# Patient Record
Sex: Female | Born: 1955 | Race: Black or African American | Hispanic: No | Smoking: Former smoker
Health system: Southern US, Community
[De-identification: ages and names within clinical notes are randomized; demographics above are authoritative.]

## PROBLEM LIST (undated history)

## (undated) DIAGNOSIS — E079 Disorder of thyroid, unspecified: Secondary | ICD-10-CM

## (undated) HISTORY — PX: ABDOMINAL HYSTERECTOMY: SHX81

---

## 1998-07-19 ENCOUNTER — Ambulatory Visit (HOSPITAL_COMMUNITY): Admission: RE | Admit: 1998-07-19 | Discharge: 1998-07-19 | Payer: Self-pay | Admitting: Internal Medicine

## 1999-04-11 ENCOUNTER — Ambulatory Visit (HOSPITAL_COMMUNITY): Admission: RE | Admit: 1999-04-11 | Discharge: 1999-04-11 | Payer: Self-pay | Admitting: Internal Medicine

## 1999-04-11 ENCOUNTER — Encounter: Payer: Self-pay | Admitting: Internal Medicine

## 1999-06-06 ENCOUNTER — Ambulatory Visit (HOSPITAL_COMMUNITY): Admission: RE | Admit: 1999-06-06 | Discharge: 1999-06-06 | Payer: Self-pay | Admitting: Gastroenterology

## 1999-10-08 ENCOUNTER — Encounter: Payer: Self-pay | Admitting: Emergency Medicine

## 1999-10-08 ENCOUNTER — Emergency Department (HOSPITAL_COMMUNITY): Admission: EM | Admit: 1999-10-08 | Discharge: 1999-10-08 | Payer: Self-pay | Admitting: Emergency Medicine

## 2000-11-07 ENCOUNTER — Emergency Department (HOSPITAL_COMMUNITY): Admission: EM | Admit: 2000-11-07 | Discharge: 2000-11-07 | Payer: Self-pay | Admitting: *Deleted

## 2000-11-09 ENCOUNTER — Encounter: Payer: Self-pay | Admitting: Emergency Medicine

## 2000-11-09 ENCOUNTER — Emergency Department (HOSPITAL_COMMUNITY): Admission: EM | Admit: 2000-11-09 | Discharge: 2000-11-09 | Payer: Self-pay | Admitting: Psychiatry

## 2000-11-14 ENCOUNTER — Emergency Department (HOSPITAL_COMMUNITY): Admission: EM | Admit: 2000-11-14 | Discharge: 2000-11-14 | Payer: Self-pay | Admitting: Emergency Medicine

## 2000-11-25 ENCOUNTER — Encounter: Payer: Self-pay | Admitting: Internal Medicine

## 2000-11-25 ENCOUNTER — Ambulatory Visit (HOSPITAL_COMMUNITY): Admission: RE | Admit: 2000-11-25 | Discharge: 2000-11-25 | Payer: Self-pay | Admitting: Internal Medicine

## 2000-12-04 ENCOUNTER — Ambulatory Visit (HOSPITAL_COMMUNITY): Admission: RE | Admit: 2000-12-04 | Discharge: 2000-12-04 | Payer: Self-pay | Admitting: Internal Medicine

## 2000-12-04 ENCOUNTER — Encounter: Payer: Self-pay | Admitting: Internal Medicine

## 2001-04-11 ENCOUNTER — Encounter: Payer: Self-pay | Admitting: Pulmonary Disease

## 2001-04-11 ENCOUNTER — Ambulatory Visit (HOSPITAL_COMMUNITY): Admission: RE | Admit: 2001-04-11 | Discharge: 2001-04-11 | Payer: Self-pay | Admitting: Pulmonary Disease

## 2001-10-27 ENCOUNTER — Emergency Department (HOSPITAL_COMMUNITY): Admission: EM | Admit: 2001-10-27 | Discharge: 2001-10-27 | Payer: Self-pay | Admitting: Emergency Medicine

## 2003-06-03 ENCOUNTER — Ambulatory Visit (HOSPITAL_BASED_OUTPATIENT_CLINIC_OR_DEPARTMENT_OTHER): Admission: RE | Admit: 2003-06-03 | Discharge: 2003-06-03 | Payer: Self-pay | Admitting: Orthopedic Surgery

## 2003-12-03 ENCOUNTER — Ambulatory Visit (HOSPITAL_COMMUNITY): Admission: RE | Admit: 2003-12-03 | Discharge: 2003-12-03 | Payer: Self-pay | Admitting: Orthopedic Surgery

## 2003-12-23 ENCOUNTER — Ambulatory Visit (HOSPITAL_BASED_OUTPATIENT_CLINIC_OR_DEPARTMENT_OTHER): Admission: RE | Admit: 2003-12-23 | Discharge: 2003-12-23 | Payer: Self-pay | Admitting: Orthopedic Surgery

## 2004-05-06 ENCOUNTER — Emergency Department (HOSPITAL_COMMUNITY): Admission: EM | Admit: 2004-05-06 | Discharge: 2004-05-07 | Payer: Self-pay | Admitting: Emergency Medicine

## 2004-05-07 ENCOUNTER — Ambulatory Visit (HOSPITAL_COMMUNITY): Admission: RE | Admit: 2004-05-07 | Discharge: 2004-05-07 | Payer: Self-pay | Admitting: Emergency Medicine

## 2004-08-14 ENCOUNTER — Encounter: Admission: RE | Admit: 2004-08-14 | Discharge: 2004-08-14 | Payer: Self-pay | Admitting: Family Medicine

## 2004-08-25 ENCOUNTER — Encounter: Admission: RE | Admit: 2004-08-25 | Discharge: 2004-08-25 | Payer: Self-pay | Admitting: Family Medicine

## 2008-08-12 ENCOUNTER — Emergency Department (HOSPITAL_COMMUNITY): Admission: EM | Admit: 2008-08-12 | Discharge: 2008-08-12 | Payer: Self-pay | Admitting: *Deleted

## 2008-12-30 ENCOUNTER — Ambulatory Visit: Payer: Self-pay | Admitting: Family Medicine

## 2008-12-30 DIAGNOSIS — Z8669 Personal history of other diseases of the nervous system and sense organs: Secondary | ICD-10-CM

## 2008-12-30 DIAGNOSIS — F410 Panic disorder [episodic paroxysmal anxiety] without agoraphobia: Secondary | ICD-10-CM

## 2009-01-06 ENCOUNTER — Encounter: Payer: Self-pay | Admitting: Family Medicine

## 2009-01-06 ENCOUNTER — Ambulatory Visit: Payer: Self-pay | Admitting: Family Medicine

## 2009-01-14 ENCOUNTER — Encounter: Payer: Self-pay | Admitting: Family Medicine

## 2009-01-14 ENCOUNTER — Ambulatory Visit: Payer: Self-pay | Admitting: Family Medicine

## 2009-01-14 LAB — CONVERTED CEMR LAB: Total CHOL/HDL Ratio: 2.4

## 2009-01-21 ENCOUNTER — Ambulatory Visit (HOSPITAL_COMMUNITY): Admission: RE | Admit: 2009-01-21 | Discharge: 2009-01-21 | Payer: Self-pay | Admitting: Family Medicine

## 2009-02-15 ENCOUNTER — Encounter: Payer: Self-pay | Admitting: Family Medicine

## 2009-02-15 LAB — CONVERTED CEMR LAB
ALT: 14 units/L
AST: 13 units/L
Bilirubin, Direct: 0.2 mg/dL
CO2: 26 meq/L
Calcium: 9.8 mg/dL
Chloride: 104 meq/L
Glucose, Bld: 91 mg/dL
Hemoglobin: 12.7 g/dL
MCHC: 32.2 g/dL
Potassium: 3.9 meq/L
RDW: 13.3 %
Sodium: 141 meq/L

## 2009-03-08 ENCOUNTER — Encounter: Payer: Self-pay | Admitting: Family Medicine

## 2009-09-12 ENCOUNTER — Encounter: Admission: RE | Admit: 2009-09-12 | Discharge: 2009-09-12 | Payer: Self-pay | Admitting: Family Medicine

## 2009-09-19 ENCOUNTER — Ambulatory Visit (HOSPITAL_COMMUNITY): Admission: RE | Admit: 2009-09-19 | Discharge: 2009-09-19 | Payer: Self-pay | Admitting: Family Medicine

## 2011-01-09 ENCOUNTER — Observation Stay (HOSPITAL_COMMUNITY)
Admission: EM | Admit: 2011-01-09 | Discharge: 2011-01-12 | Payer: Self-pay | Source: Home / Self Care | Attending: Internal Medicine | Admitting: Internal Medicine

## 2011-01-10 LAB — COMPREHENSIVE METABOLIC PANEL
ALT: 24 U/L (ref 0–35)
AST: 19 U/L (ref 0–37)
AST: 16 U/L (ref 0–37)
Albumin: 3.2 g/dL — ABNORMAL LOW (ref 3.5–5.2)
Albumin: 2.8 g/dL — ABNORMAL LOW (ref 3.5–5.2)
Alkaline Phosphatase: 41 U/L (ref 39–117)
Alkaline Phosphatase: 43 U/L (ref 39–117)
BUN: 14 mg/dL (ref 6–23)
BUN: 11 mg/dL (ref 6–23)
CO2: 25 mEq/L (ref 19–32)
CO2: 26 mEq/L (ref 19–32)
Calcium: 10 mg/dL (ref 8.4–10.5)
Chloride: 106 mEq/L (ref 96–112)
Chloride: 109 mEq/L (ref 96–112)
Creatinine, Ser: 0.52 mg/dL (ref 0.4–1.2)
GFR calc Af Amer: >60 mL/min (ref >60)
GFR calc Af Amer: >60 mL/min (ref >60)
GFR calc non Af Amer: >60 mL/min (ref >60)
GFR calc non Af Amer: >60 mL/min (ref >60)
Glucose, Bld: 85 mg/dL (ref 70–99)
Potassium: 3.7 mEq/L (ref 3.5–5.1)
Potassium: 3.6 mEq/L (ref 3.5–5.1)
Sodium: 141 mEq/L (ref 135–145)
Total Bilirubin: 1.3 mg/dL — ABNORMAL HIGH (ref 0.3–1.2)
Total Bilirubin: 0.9 mg/dL (ref 0.3–1.2)
Total Protein: 6.1 g/dL (ref 6.0–8.3)

## 2011-01-10 LAB — DIFFERENTIAL
Basophils Absolute: 0 10*3/uL (ref 0.0–0.1)
Basophils Relative: 0 % (ref 0–1)
Eosinophils Absolute: 0.2 10*3/uL (ref 0.0–0.7)
Eosinophils Relative: 2 % (ref 0–5)
Eosinophils Relative: 5 % (ref 0–5)
Lymphocytes Relative: 28 % (ref 12–46)
Lymphocytes Relative: 38 % (ref 12–46)
Lymphs Abs: 2.2 10*3/uL (ref 0.7–4.0)
Lymphs Abs: 2.3 10*3/uL (ref 0.7–4.0)
Monocytes Absolute: 1.1 10*3/uL — ABNORMAL HIGH (ref 0.1–1.0)
Monocytes Relative: 15 % — ABNORMAL HIGH (ref 3–12)
Monocytes Relative: 15 % — ABNORMAL HIGH (ref 3–12)
Neutro Abs: 4.1 10*3/uL (ref 1.7–7.7)
Neutrophils Relative %: 54 % (ref 43–77)

## 2011-01-10 LAB — EXPECTORATED SPUTUM ASSESSMENT W GRAM STAIN, RFLX TO RESP C

## 2011-01-10 LAB — CBC
HCT: 32.1 % — ABNORMAL LOW (ref 36.0–46.0)
HCT: 30.9 % — ABNORMAL LOW (ref 36.0–46.0)
Hemoglobin: 10.6 g/dL — ABNORMAL LOW (ref 12.0–15.0)
MCH: 28.6 pg (ref 26.0–34.0)
MCH: 28.8 pg (ref 26.0–34.0)
MCHC: 33 g/dL (ref 30.0–36.0)
MCV: 86.8 fL (ref 78.0–100.0)
MCV: 88 fL (ref 78.0–100.0)
Platelets: 263 10*3/uL (ref 150–400)
RBC: 3.7 MIL/uL — ABNORMAL LOW (ref 3.87–5.11)
RDW: 12.1 % (ref 11.5–15.5)
RDW: 12.2 % (ref 11.5–15.5)
WBC: 7.6 10*3/uL (ref 4.0–10.5)
WBC: 6 10*3/uL (ref 4.0–10.5)

## 2011-01-10 LAB — POCT CARDIAC MARKERS
CKMB, poc: 1.8 ng/mL (ref 1.0–8.0)
Myoglobin, poc: 123 ng/mL (ref 12–200)
Troponin i, poc: <0.05 ng/mL (ref 0.00–0.09)

## 2011-01-10 LAB — LIPID PANEL
Cholesterol: 115 mg/dL (ref 0–200)
HDL: 47 mg/dL (ref >39)
LDL Cholesterol: 52 mg/dL (ref 0–99)
Total CHOL/HDL Ratio: 2.4 RATIO
Triglycerides: 78 mg/dL (ref <150)
VLDL: 16 mg/dL (ref 0–40)

## 2011-01-10 LAB — CK TOTAL AND CKMB (NOT AT ARMC)
CK, MB: 1.4 ng/mL (ref 0.3–4.0)
Relative Index: INVALID (ref 0.0–2.5)
Total CK: 71 U/L (ref 7–177)

## 2011-01-10 LAB — D-DIMER, QUANTITATIVE: D-Dimer, Quant: 1.15 ug/mL-FEU — ABNORMAL HIGH (ref 0.00–0.48)

## 2011-01-10 LAB — HEMOGLOBIN A1C: Mean Plasma Glucose: 103 mg/dL (ref <117)

## 2011-01-10 LAB — RAPID STREP SCREEN (MED CTR MEBANE ONLY): Streptococcus, Group A Screen (Direct): NEGATIVE

## 2011-01-10 LAB — PHOSPHORUS: Phosphorus: 5.1 mg/dL — ABNORMAL HIGH (ref 2.3–4.6)

## 2011-01-10 LAB — IRON AND TIBC
Iron: 46 ug/dL (ref 42–135)
Saturation Ratios: 19 % — ABNORMAL LOW (ref 20–55)
TIBC: 246 ug/dL — ABNORMAL LOW (ref 250–470)
UIBC: 200 ug/dL

## 2011-01-10 LAB — VITAMIN B12: Vitamin B-12: 444 pg/mL (ref 211–911)

## 2011-01-10 LAB — TROPONIN I: Troponin I: 0.01 ng/mL (ref 0.00–0.06)

## 2011-01-10 LAB — APTT: aPTT: 34 seconds (ref 24–37)

## 2011-01-10 LAB — FOLATE: Folate: >20 ng/mL

## 2011-01-11 LAB — HEMOCCULT GUIAC POC 1CARD (OFFICE)
Fecal Occult Bld: NEGATIVE
Fecal Occult Bld: NEGATIVE

## 2011-01-11 LAB — TSH: TSH: <0.008 u[IU]/mL — ABNORMAL LOW (ref 0.350–4.500)

## 2011-01-11 LAB — T4, FREE: Free T4: 2.85 ng/dL — ABNORMAL HIGH (ref 0.80–1.80)

## 2011-01-12 ENCOUNTER — Other Ambulatory Visit (HOSPITAL_COMMUNITY): Payer: Self-pay | Admitting: Internal Medicine

## 2011-01-12 DIAGNOSIS — E039 Hypothyroidism, unspecified: Secondary | ICD-10-CM

## 2011-01-12 DIAGNOSIS — R0602 Shortness of breath: Secondary | ICD-10-CM

## 2011-01-12 LAB — HEMOCCULT GUIAC POC 1CARD (OFFICE): Fecal Occult Bld: NEGATIVE

## 2011-01-13 NOTE — H&P (Signed)
Angel Holt, Angel Holt                ACCOUNT NO.:  [id number]  MEDICAL RECORD NO.:  [id number]          PATIENT TYPE:  EMS  LOCATION:  MAJO                         FACILITY:  [facility]  PHYSICIAN:  Michiel Cowboy, MDDATE OF BIRTH:  11-16-1956  DATE OF ADMISSION:  01/09/2011 DATE OF DISCHARGE:                             HISTORY & PHYSICAL   ATTENDING PHYSICIAN:  Michiel Cowboy, MD  PRIMARY CARE PROVIDER:  The patient states she is unsure, therefore she has not seen anybody.  CHIEF COMPLAINT:  Shortness of breath and trouble swallowing.  HISTORY OF PRESENT ILLNESS:  The patient is a 55 year old female with past medical history which is not really contributory who for the past 3 weeks have been having worsening dyspnea and exertion particularly when she was walking up and down stairs.  Also occasional mild nonspecific chest pains which are short lived.  Also she has been feeling like her mouth is very dry and she has been doing a lot of mouth breathing.  When she swallows, she feels like it is going a wrong way and she feels like her throat is closed.  Of note, she has no stridor or wheezing on exam. Otherwise, no fevers.  She has had cold-like symptoms also for the past few weeks with runny nose, cough, productive of phlegm.  No diarrhea, otherwise review of systems is negative.  The patient did endorse that she feels like she has had unintentional weight loss over the past 3 weeks up to 20 pounds.  PAST MEDICAL HISTORY:  Noncontributory.  SOCIAL HISTORY:  The patient used to smoke 20 years ago, does not currently, does not abuse drugs, does not drink.  FAMILY HISTORY:  Significant for a sister with diabetes mellitus.  ALLERGIES:  No known drug allergies.  MEDICATIONS:  Occasionally takes Mucinex recently for her cough.  PHYSICAL EXAMINATION:  VITAL SIGNS:  Temperature 98.5, blood pressure 117/82, pulse 106 initially, now down to 96, respirations 18, and satting  99% on room air. GENERAL:  The patient appears to be in no acute distress. HEAD:  Nontraumatic.  Moist mucous membranes. LUNGS:  Clear to auscultation bilaterally.  No wheezes or crackles appreciated.  No stridor appreciated. HEART:  Somewhat rapid but regular.  No murmurs appreciated. ABDOMEN:  Soft, nontender, nondistended, slightly obese. LOWER EXTREMITIES:  Without clubbing, cyanosis, or edema. NEUROLOGIC:  Grossly intact. SKIN:  Clean, dry, and intact. THYROID:  Slightly enlarged but otherwise unremarkable.  No lymphadenopathy noted. HEAD AND NECK:  She does seem to have some enlargement of the left tonsil, but no significant exudate could be appreciated.  She does not have hot potato voice.  LABORATORY DATA:  White blood cell count 7.6, hemoglobin 10.6, sodium 141, potassium 3.7, creatinine 0.52, D-dimer elevated 1.15.  Chest x-ray unremarkable.  CT scan showing no PE.  Chronic nodules was present and mild goiter, but otherwise unremarkable.  EKG showing normal sinus rhythm, slightly rapid heart rate of 100, but no ST changes.  ASSESSMENT AND PLAN:  This is a 55 year old female with shortness of breath for unclear etiology in the setting of recent cold-like symptoms ,but also  endorsing the weight loss. 1. Shortness of breath, etiology very unclear, pulmonary versus     cardiac versus anxiety.  We will admit for observation.  Given her     age and presentation of shortness of breath and occasional chest     pain, we will cycle cardiac enzymes.  Check 2-D echo to make sure     she has preserved EF.  We will do a 6-minute walk to monitor her O2     sat, check fasting lipid panel, hemoglobin A1c.  Give p.r.n.     albuterol if needed.  Continue with Mucinex and Robitussin.  At     this point, CT scan did not show any evidence of infection.  She     does not have a white blood cell count.  I will hold off on     antibiotics for now.  If her symptoms consist, consider Pulmonary      consult.  Given some dysphagia like symptoms of trouble swallowing,     we will do barium swallow evaluation.  The patient does not appear     to be toxic and had no lymphadenopathy or swollen, painful neck     pain.  I do think symptoms were consistent with peritonsillar     abscess, most likely she has a viral etiology of bronchitis which     has caused shortness of breath, but will be on a safe side and     evaluate this further. 2. Weight loss, this is unclear.  We will check TSH levels and     sedimentation rate.  Of note, she does have history of pulmonary     nodules, this needs to be further followed up and that is why I     think to have an pulmonary followup will be good for this patient. 3. Prophylaxis, good p.o. intake and SCDs. 4. Anemia, this is mild but we will further evaluate as suppose it is     possible that some degree of anemia is contributing to her     shortness of breath.     Michiel Cowboy, MD     AVD/MEDQ  D:  01/09/2011  T:  01/10/2011  Job:  [id number]  Electronically Signed by Therisa Doyne MD on 01/13/2011 06:25:12 AM

## 2011-01-24 ENCOUNTER — Emergency Department (HOSPITAL_COMMUNITY)
Admission: EM | Admit: 2011-01-24 | Discharge: 2011-01-24 | Disposition: A | Discharge status: Home / Self Care | Payer: Self-pay | Attending: Emergency Medicine | Admitting: Emergency Medicine

## 2011-01-24 DIAGNOSIS — E0590 Thyrotoxicosis, unspecified without thyrotoxic crisis or storm: Secondary | ICD-10-CM | POA: Insufficient documentation

## 2011-01-24 DIAGNOSIS — J029 Acute pharyngitis, unspecified: Secondary | ICD-10-CM | POA: Insufficient documentation

## 2011-01-30 NOTE — Discharge Summary (Signed)
Angel Holt, Angel Holt                ACCOUNT NO.:  [id number]  MEDICAL RECORD NO.:  [id number]          PATIENT TYPE:  OBV  LOCATION:  5528                         FACILITY:  [facility]  PHYSICIAN:  Baltazar Najjar, MD     DATE OF BIRTH:  July 02, 1956  DATE OF ADMISSION:  01/09/2011 DATE OF DISCHARGE:  01/12/2011                              DISCHARGE SUMMARY   FINAL DISCHARGE DIAGNOSES: 1. Dyspnea. 2. Pulmonary nodules. 3. Hyperparathyroidism, newly diagnosed. 4. Elevated ACE level of unclear significance. 5. Dysphagia.  CONSULTATION:  During this hospitalization; Pulmonary service.  The patient was seen by Dr. Sung Amabile from [facility].  PROCEDURES/IMAGING: 1. PFT done January 11, 2011 was normal.  Chest x-ray showed no acute     cardiopulmonary event.  CT angiogram of the chest showed no     pulmonary emboli or acute abnormalities seen. 2. Chronic bilateral lower lobe nodules. 3. Mild thyroid goiter.  BRIEF ADMITTING HISTORY:  Please refer to the H and P dictated January 09, 2011.  SUMMARY:  Angel Holt is a 55 year old African American woman with no significant past medical history presented to the ER with shortness of breath and difficulty swallowing.  HOSPITAL COURSE:  The patient was admitted to the medical floor. 1. Shortness of breath.  The patient had a chest x-ray, CT angiogram     of the chest and PFT all were unremarkable except for chronic     pulmonary nodules.  The patient was seen by Pulmonary service, Dr.     Sung Amabile who recommended no further workup of her chronic pulmonary     nodules, which she felt most likely benign.  As far as her dyspnea,     she recommended no further workup and she stated that it could be     related to her hyperparathyroidism and she recommended to treat     hyperparathyroidism.  As far as her elevated ACE level, Dr. Sung Amabile     thinks it is not significant and relevant. 2. Newly diagnosed hyperparathyroidism.  The patient was  noted to have     low T4 and low TSH consistent with hyperparathyroidism.  However,     the cause of her hyperparathyroidism is unclear at this point, so     24-hour iodine uptake thyroid scan was ordered by me.  However, as     per nuclear medicine service since the patient had a recent CT scan     with contrast as they were unable to do the test except in 6-week     period from her receiving contrast, so that test was scheduled to     be done as an outpatient on February 20, 2011.  After determining, this     etiology of her hyperparathyroidism, the patient will need to be     treated for the appropriate treatment based on the code.  I will     defer that to her PCP.  I started her on atenolol for tachycardia     12.5 mg p.o. daily. 3. Weight loss, most likely secondary to hyperparathyroidism. 4. Dysphagia.  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  There was no     obstruction on Zenker diverticulum found on the esophagram.  She     was also incidentally found to have small intermittent sliding     hiatal hernia.  There was no stricture, reflux or obstruction or     esophagitis noted.  No masses.  No motility disorder.  I personally     think probably her thyroid goiter and her osteophyte in her     cervical spine both contributing to her sensation of throat     dysphagia.  Further follow up as per PCP. 5. The patient was seen and examined by me today and she is stable for     discharge to home today with arrangement for her to follow with her     PCP at [facility] for further workup of her hyperparathyroidism     and other medical problems.  Case manager  already scheduled her     with [facility] appointment with Dr. Sherryll Burger.  DISCHARGE MEDICATIONS: 1. Atenolol 12.5 mg p.o. daily. 2. Multivitamin 1 tablet p.o. daily.  DISCHARGE INSTRUCTIONS: 1. The patient instructed to follow with nuclear scan at [facility]     Radiology for thyroid scan on February 20, 2011 at 9 a.m. 2. The patient to follow with Dr. Sherryll Burger at [facility] on March 02, 2011, at 10:30 a.m. 3. The patient to report any worsening of symptoms or any new symptoms     to PCP or come back to the ED.  CONDITION ON DISCHARGE:  Stable.          ______________________________ Baltazar Najjar, MD     SA/MEDQ  D:  01/12/2011  T:  01/12/2011  Job:  [id number]  Electronically Signed by Hannah Beat MD on 01/30/2011 08:14:55 PM

## 2011-02-14 ENCOUNTER — Emergency Department (HOSPITAL_COMMUNITY)
Admission: EM | Admit: 2011-02-14 | Discharge: 2011-02-14 | Disposition: A | Discharge status: Home / Self Care | Payer: Self-pay | Attending: Internal Medicine | Admitting: Internal Medicine

## 2011-02-14 ENCOUNTER — Emergency Department (HOSPITAL_COMMUNITY): Payer: Self-pay

## 2011-02-14 DIAGNOSIS — E213 Hyperparathyroidism, unspecified: Secondary | ICD-10-CM | POA: Insufficient documentation

## 2011-02-14 DIAGNOSIS — J3489 Other specified disorders of nose and nasal sinuses: Secondary | ICD-10-CM | POA: Insufficient documentation

## 2011-02-14 DIAGNOSIS — R1110 Vomiting, unspecified: Secondary | ICD-10-CM | POA: Insufficient documentation

## 2011-02-14 DIAGNOSIS — R059 Cough, unspecified: Secondary | ICD-10-CM | POA: Insufficient documentation

## 2011-02-14 DIAGNOSIS — J069 Acute upper respiratory infection, unspecified: Secondary | ICD-10-CM | POA: Insufficient documentation

## 2011-02-14 DIAGNOSIS — Z79899 Other long term (current) drug therapy: Secondary | ICD-10-CM | POA: Insufficient documentation

## 2011-02-14 DIAGNOSIS — R05 Cough: Secondary | ICD-10-CM | POA: Insufficient documentation

## 2011-02-20 ENCOUNTER — Ambulatory Visit (HOSPITAL_COMMUNITY)
Admission: RE | Admit: 2011-02-20 | Discharge: 2011-02-20 | Disposition: A | Discharge status: Home / Self Care | Payer: Self-pay | Source: Ambulatory Visit | Attending: Internal Medicine | Admitting: Internal Medicine

## 2011-02-20 DIAGNOSIS — E039 Hypothyroidism, unspecified: Secondary | ICD-10-CM

## 2011-02-21 ENCOUNTER — Ambulatory Visit (HOSPITAL_COMMUNITY)
Admission: RE | Admit: 2011-02-21 | Discharge: 2011-02-21 | Disposition: A | Discharge status: Home / Self Care | Payer: Self-pay | Source: Ambulatory Visit | Attending: Internal Medicine | Admitting: Internal Medicine

## 2011-02-21 DIAGNOSIS — R631 Polydipsia: Secondary | ICD-10-CM | POA: Insufficient documentation

## 2011-02-21 DIAGNOSIS — L659 Nonscarring hair loss, unspecified: Secondary | ICD-10-CM | POA: Insufficient documentation

## 2011-02-21 DIAGNOSIS — R5383 Other fatigue: Secondary | ICD-10-CM | POA: Insufficient documentation

## 2011-02-21 DIAGNOSIS — R946 Abnormal results of thyroid function studies: Secondary | ICD-10-CM | POA: Insufficient documentation

## 2011-02-21 DIAGNOSIS — L851 Acquired keratosis [keratoderma] palmaris et plantaris: Secondary | ICD-10-CM | POA: Insufficient documentation

## 2011-02-21 DIAGNOSIS — R61 Generalized hyperhidrosis: Secondary | ICD-10-CM | POA: Insufficient documentation

## 2011-02-21 DIAGNOSIS — R259 Unspecified abnormal involuntary movements: Secondary | ICD-10-CM | POA: Insufficient documentation

## 2011-02-21 DIAGNOSIS — R002 Palpitations: Secondary | ICD-10-CM | POA: Insufficient documentation

## 2011-02-21 DIAGNOSIS — R634 Abnormal weight loss: Secondary | ICD-10-CM | POA: Insufficient documentation

## 2011-02-21 DIAGNOSIS — G4700 Insomnia, unspecified: Secondary | ICD-10-CM | POA: Insufficient documentation

## 2011-02-21 DIAGNOSIS — R5381 Other malaise: Secondary | ICD-10-CM | POA: Insufficient documentation

## 2011-02-21 DIAGNOSIS — R454 Irritability and anger: Secondary | ICD-10-CM | POA: Insufficient documentation

## 2011-02-21 MED ORDER — SODIUM IODIDE I 131 CAPSULE
10.0000 | Freq: Once | INTRAVENOUS | Status: AC | PRN
  Administered 2011-02-20: 10 via ORAL

## 2011-02-21 MED ORDER — SODIUM PERTECHNETATE TC 99M INJECTION
10.0000 | Freq: Once | INTRAVENOUS | Status: AC | PRN
  Administered 2011-02-21: 10 via INTRAVENOUS

## 2011-03-02 ENCOUNTER — Encounter: Payer: Self-pay | Admitting: Family Medicine

## 2011-03-02 LAB — CONVERTED CEMR LAB
Free Thyroxine Index: 6.8 — ABNORMAL HIGH (ref 1.0–3.9)
T3 Uptake Ratio: 39.8 % — ABNORMAL HIGH (ref 22.5–37.0)
T4, Total: 17.2 ug/dL — ABNORMAL HIGH (ref 5.0–12.5)

## 2011-03-26 ENCOUNTER — Emergency Department (HOSPITAL_COMMUNITY): Payer: Self-pay

## 2011-03-26 ENCOUNTER — Emergency Department (HOSPITAL_COMMUNITY)
Admission: EM | Admit: 2011-03-26 | Discharge: 2011-03-26 | Disposition: A | Discharge status: Home / Self Care | Payer: Self-pay | Attending: Emergency Medicine | Admitting: Emergency Medicine

## 2011-03-26 DIAGNOSIS — Z79899 Other long term (current) drug therapy: Secondary | ICD-10-CM | POA: Insufficient documentation

## 2011-03-26 DIAGNOSIS — I2510 Atherosclerotic heart disease of native coronary artery without angina pectoris: Secondary | ICD-10-CM | POA: Insufficient documentation

## 2011-03-26 DIAGNOSIS — E213 Hyperparathyroidism, unspecified: Secondary | ICD-10-CM | POA: Insufficient documentation

## 2011-03-26 DIAGNOSIS — R002 Palpitations: Secondary | ICD-10-CM | POA: Insufficient documentation

## 2011-03-26 DIAGNOSIS — R079 Chest pain, unspecified: Secondary | ICD-10-CM | POA: Insufficient documentation

## 2011-03-26 LAB — POCT CARDIAC MARKERS
CKMB, poc: <1 ng/mL — ABNORMAL LOW (ref 1.0–8.0)
Myoglobin, poc: 51.9 ng/mL (ref 12–200)
Myoglobin, poc: 49.3 ng/mL (ref 12–200)

## 2011-03-26 LAB — BASIC METABOLIC PANEL
CO2: 26 mEq/L (ref 19–32)
Calcium: 9.6 mg/dL (ref 8.4–10.5)
Chloride: 106 mEq/L (ref 96–112)
Creatinine, Ser: 0.62 mg/dL (ref 0.4–1.2)
GFR calc Af Amer: >60 mL/min (ref >60)
Sodium: 138 mEq/L (ref 135–145)

## 2011-03-26 LAB — POCT I-STAT, CHEM 8
BUN: 11 mg/dL (ref 6–23)
Calcium, Ion: 1.27 mmol/L (ref 1.12–1.32)
Chloride: 106 mEq/L (ref 96–112)
HCT: 37 % (ref 36.0–46.0)
Sodium: 142 mEq/L (ref 135–145)
TCO2: 27 mmol/L (ref 0–100)

## 2011-03-26 LAB — CBC
Platelets: 261 10*3/uL (ref 150–400)
RBC: 4.29 MIL/uL (ref 3.87–5.11)
RDW: 13.6 % (ref 11.5–15.5)
WBC: 6.5 10*3/uL (ref 4.0–10.5)

## 2011-04-11 ENCOUNTER — Other Ambulatory Visit (HOSPITAL_COMMUNITY): Payer: Self-pay | Admitting: Family Medicine

## 2011-04-11 DIAGNOSIS — E0590 Thyrotoxicosis, unspecified without thyrotoxic crisis or storm: Secondary | ICD-10-CM

## 2011-04-11 DIAGNOSIS — E049 Nontoxic goiter, unspecified: Secondary | ICD-10-CM

## 2011-04-17 ENCOUNTER — Ambulatory Visit (HOSPITAL_COMMUNITY)
Admission: RE | Admit: 2011-04-17 | Discharge: 2011-04-17 | Disposition: A | Discharge status: Home / Self Care | Payer: Self-pay | Source: Ambulatory Visit | Attending: Family Medicine | Admitting: Family Medicine

## 2011-04-17 DIAGNOSIS — E0500 Thyrotoxicosis with diffuse goiter without thyrotoxic crisis or storm: Secondary | ICD-10-CM | POA: Insufficient documentation

## 2011-04-17 DIAGNOSIS — E0590 Thyrotoxicosis, unspecified without thyrotoxic crisis or storm: Secondary | ICD-10-CM

## 2011-04-17 DIAGNOSIS — E049 Nontoxic goiter, unspecified: Secondary | ICD-10-CM

## 2011-04-27 ENCOUNTER — Other Ambulatory Visit: Payer: Self-pay | Admitting: Family Medicine

## 2011-04-27 ENCOUNTER — Other Ambulatory Visit (HOSPITAL_COMMUNITY): Payer: Self-pay | Admitting: Family Medicine

## 2011-04-27 DIAGNOSIS — Z1231 Encounter for screening mammogram for malignant neoplasm of breast: Secondary | ICD-10-CM

## 2011-05-04 ENCOUNTER — Ambulatory Visit (HOSPITAL_COMMUNITY): Payer: Self-pay

## 2011-05-04 NOTE — Op Note (Signed)
   NAME:  Angel Holt, Angel Holt                          ACCOUNT NO.:  [id number]   MEDICAL RECORD NO.:  [id number]                   PATIENT TYPE:  AMB   LOCATION:  DSC                                  FACILITY:  [facility]   PHYSICIAN:  Katy Fitch. Naaman Plummer., M.D.          DATE OF BIRTH:  [birth year]   DATE OF PROCEDURE:  06/03/2003  DATE OF DISCHARGE:                                 OPERATIVE REPORT   PREOPERATIVE DIAGNOSIS:  Chronic entrapment neuropathy, median nerve right  carpal tunnel.   POSTOPERATIVE DIAGNOSIS:  Chronic entrapment neuropathy, median nerve right  carpal tunnel.   PROCEDURE:  Release of right transverse carpal ligament.   SURGEON:  Katy Fitch. Sypher, M.D.   ASSISTANT:  Jonni Sanger, P.A.-C   ANESTHESIA:  General by LMA.   SUPERVISING ANESTHESIOLOGIST:  Janetta Hora. Gelene Mink, M.D.   INDICATIONS:  This patient is a 55 year old woman referred for evaluation  and management of hand numbness.  Clinical examination revealed signs of  carpal tunnel syndrome and electrodiagnostic studies confirm median  neuropathy.  Due to a failure to respond to nonoperative measures, she was  brought to the operating room at this time for release of her transverse  carpal ligament.   DESCRIPTION OF PROCEDURE:  Tela Shall was brought to the operating room  and placed in the supine position on the operating table.  Following  induction of general anesthesia, the right arm was prepped with Betadine  soap solution and sterilely draped.  Upon exsanguination of the limb with an  Esmarch bandage, a tourniquet was inflated on the proximal brachium up to  220 mmHg.   The procedure commenced with a short incision in the line of the ring finger  and the palm.  The subcutaneous tissues were carefully divided in the normal  palmar fashion.  They were split longitudinally to encompass the extent of  the median nerve.   These were followed back to the transverse carpal ligaments and the  median  nerve proper was carefully isolated from the ligament.  The ligament was  released with scissors along its ulnar border extending into the distal  forearm.  This widely opened up the canal.  No masses or pigments were  noted.   Bleeding points along the margin of the loose ligament were  electrocauterized with bipolar current followed by repair of the skin with  internal pullout suture.   Compressive dressing was applied with a volar plaster splint and bandages.  The patient tolerated the procedure well.                                               Katy Fitch Naaman Plummer., M.D.    RVS/MEDQ  D:  06/03/2003  T:  06/04/2003  Job:  [id number]

## 2011-05-04 NOTE — Op Note (Signed)
NAME:  Angel Holt, Angel Holt                          ACCOUNT NO.:  [id number]   MEDICAL RECORD NO.:  [id number]                   PATIENT TYPE:  AMB   LOCATION:  DSC                                  FACILITY:  [facility]   PHYSICIAN:  Katy Fitch. Naaman Plummer., M.D.          DATE OF BIRTH:  14-Oct-1956   DATE OF PROCEDURE:  12/23/2003  DATE OF DISCHARGE:                                 OPERATIVE REPORT   PREOPERATIVE DIAGNOSIS:  Chronic entrapment neuropathy median nerve, left  carpal tunnel.   POSTOPERATIVE DIAGNOSIS:  Chronic entrapment neuropathy median nerve, left  carpal tunnel.   OPERATION:  Release of left transverse carpal ligament.   SURGEON:  Katy Fitch. Sypher, M.D.   ASSISTANT:  Jonni Sanger, P.A.   ANESTHESIA:  General by LMA.   ANESTHESIOLOGIST:  Janetta Hora. Gelene Mink, M.D.   INDICATIONS FOR PROCEDURE:  [name] is a 55 year old woman referred for  evaluation and management of hand pain and numbness.  Clinical examination  revealed signs of bilateral carpal tunnel syndrome.  She is status post  electrodiagnostic studies May 03, 2003, which documented bilateral carpal  tunnel syndrome.  She is status post right carpal tunnel release in the  summer 2004.  She had treatment of her neck and shoulder at [facility] and now returns for left carpal tunnel release.   PROCEDURE:  Kruti Gaona is brought to the operating room and placed on  supine position on the operating table.  Following induction of general  anesthesia by LMA, the left arm was prepped with Betadine solution and  sterilely draped.  Following exsanguination of the left arm with an Esmarch  bandage, an arterial tourniquet was inflated to 220 mmHg.  The procedure  commenced with a short incision in the line of the ring finger in the palm.  The subcutaneous tissues were carefully divided revealing the palmar fascia.  This was split longitudinally to the common sensory branches of the median  nerve.   These were followed back to the transverse carpal ligament which was  carefully isolated from the median nerve.  The ligament was released along  its ulnar border extending into the distal forearm.  This widely opened the  carpal canal.  No masses were noted.  Bleeding points were electrocauterized  with bipolar current followed by repair of the skin with intradermal 3-0  Prolene suture.  A compressive dressing was applied with a volar plaster  splint with the wrist in 5 degrees dorsiflexion.   For aftercare, Ms. Ebersole is given a prescription for Percocet 5 mg, 1-2  tablets p.o. q.4-6h. p.r.n. pain.  She will return to the office for follow  up in a week for dressing change and advancement of an exercise program.  Katy Fitch Naaman Plummer., M.D.    RVS/MEDQ  D:  12/23/2003  T:  12/23/2003  Job:  [id number]

## 2011-05-11 ENCOUNTER — Ambulatory Visit (HOSPITAL_COMMUNITY)
Admission: RE | Admit: 2011-05-11 | Discharge: 2011-05-11 | Disposition: A | Discharge status: Home / Self Care | Payer: Self-pay | Source: Ambulatory Visit | Attending: Family Medicine | Admitting: Family Medicine

## 2011-05-11 DIAGNOSIS — Z1231 Encounter for screening mammogram for malignant neoplasm of breast: Secondary | ICD-10-CM | POA: Insufficient documentation

## 2011-07-28 IMAGING — CR DG CHEST 2V
2 series · 2 of 2 positions shown · non-contrast
Comparison: 02/14/2011.

CLINICAL DATA: Intermittent left chest pain since yesterday.

CHEST - 2 VIEW

[w chest pa]
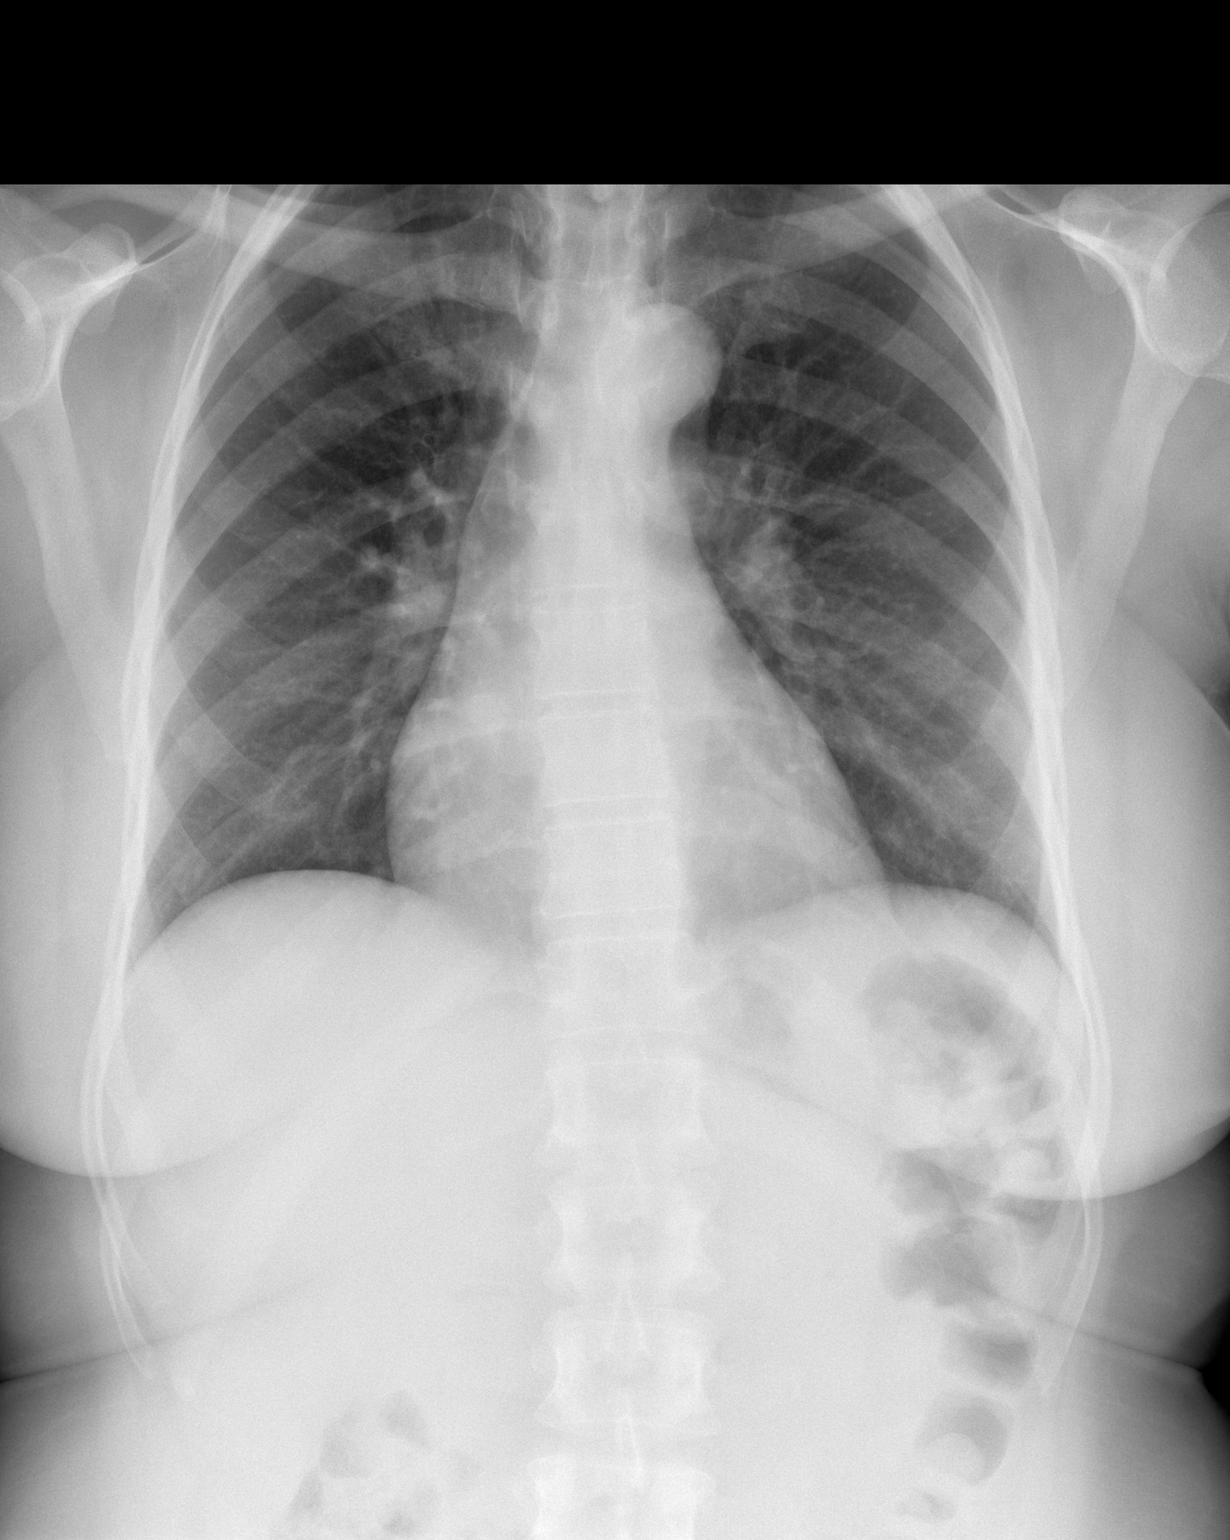

[w chest lat]
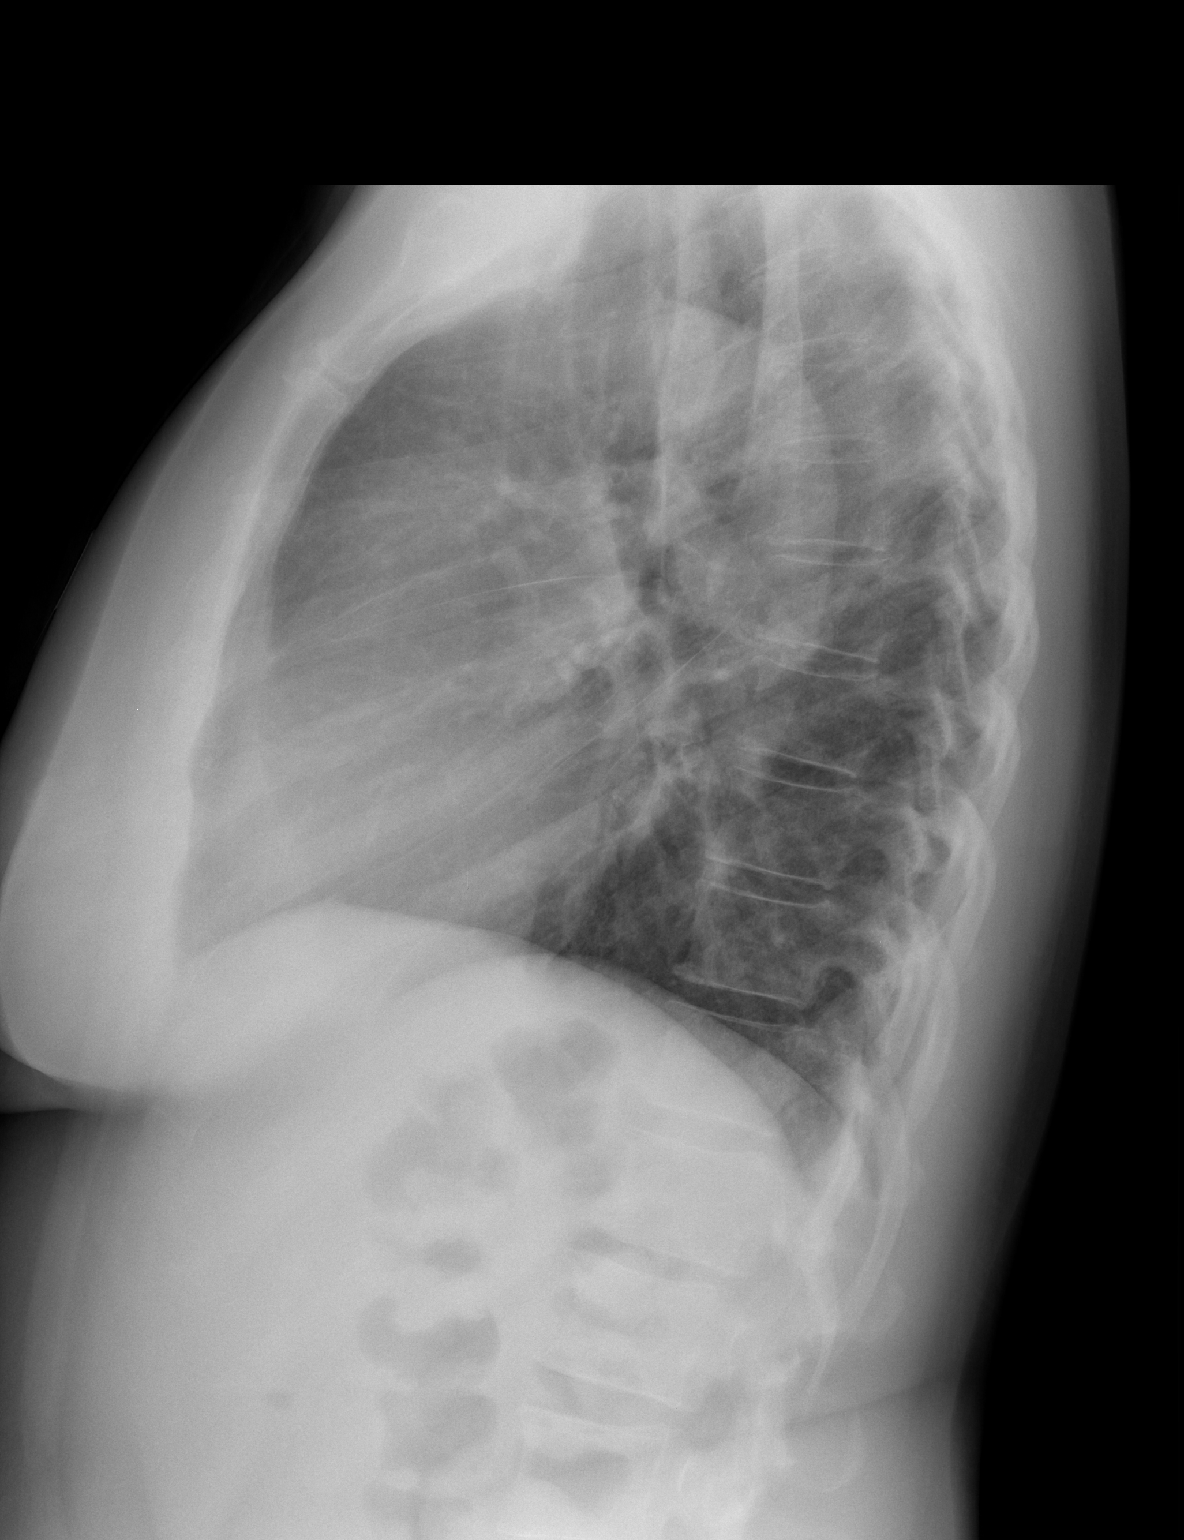

[2 of 2 positions shown; findings below may reference images not displayed]

FINDINGS: The heart size and mediastinal contours are stable.  The
lungs are clear.  There is no pleural effusion or pneumothorax.  No
acute osseous findings are identified.
IMPRESSION: Stable examination.  No active cardiopulmonary process.

## 2011-10-31 ENCOUNTER — Other Ambulatory Visit: Payer: Self-pay | Admitting: Otolaryngology

## 2011-10-31 DIAGNOSIS — R1319 Other dysphagia: Secondary | ICD-10-CM

## 2011-11-26 ENCOUNTER — Other Ambulatory Visit: Payer: Self-pay

## 2012-06-06 ENCOUNTER — Other Ambulatory Visit (HOSPITAL_COMMUNITY): Payer: Self-pay | Admitting: Family Medicine

## 2012-06-06 DIAGNOSIS — E042 Nontoxic multinodular goiter: Secondary | ICD-10-CM

## 2012-06-13 ENCOUNTER — Ambulatory Visit (HOSPITAL_COMMUNITY)
Admission: RE | Admit: 2012-06-13 | Discharge: 2012-06-13 | Disposition: A | Discharge status: Home / Self Care | Payer: Self-pay | Source: Ambulatory Visit | Attending: Family Medicine | Admitting: Family Medicine

## 2012-06-13 DIAGNOSIS — E042 Nontoxic multinodular goiter: Secondary | ICD-10-CM | POA: Insufficient documentation

## 2012-07-08 ENCOUNTER — Other Ambulatory Visit: Payer: Self-pay | Admitting: Family Medicine

## 2012-07-08 ENCOUNTER — Other Ambulatory Visit (HOSPITAL_COMMUNITY): Payer: Self-pay | Admitting: Family Medicine

## 2012-07-08 DIAGNOSIS — R221 Localized swelling, mass and lump, neck: Secondary | ICD-10-CM

## 2012-07-08 DIAGNOSIS — E041 Nontoxic single thyroid nodule: Secondary | ICD-10-CM

## 2012-07-09 ENCOUNTER — Other Ambulatory Visit (HOSPITAL_COMMUNITY): Payer: Self-pay

## 2012-07-15 ENCOUNTER — Ambulatory Visit (HOSPITAL_COMMUNITY)
Admission: RE | Admit: 2012-07-15 | Discharge: 2012-07-15 | Disposition: A | Discharge status: Home / Self Care | Payer: Self-pay | Source: Ambulatory Visit | Attending: Family Medicine | Admitting: Family Medicine

## 2012-07-15 DIAGNOSIS — E041 Nontoxic single thyroid nodule: Secondary | ICD-10-CM | POA: Insufficient documentation

## 2012-07-15 NOTE — Procedures (Signed)
Procedure : left dominant thyroid nodule needle biopsy Specimen : 25 g FNA x 3 Complications : none immediate  Patient tolerated well. Full report in canopy

## 2012-08-20 ENCOUNTER — Encounter (HOSPITAL_COMMUNITY): Payer: Self-pay | Admitting: Emergency Medicine

## 2012-08-20 ENCOUNTER — Emergency Department (HOSPITAL_COMMUNITY)
Admission: EM | Admit: 2012-08-20 | Discharge: 2012-08-21 | Disposition: A | Discharge status: Home / Self Care | Payer: Self-pay | Attending: Emergency Medicine | Admitting: Emergency Medicine

## 2012-08-20 ENCOUNTER — Emergency Department (HOSPITAL_COMMUNITY): Payer: Self-pay

## 2012-08-20 DIAGNOSIS — M65839 Other synovitis and tenosynovitis, unspecified forearm: Secondary | ICD-10-CM | POA: Insufficient documentation

## 2012-08-20 HISTORY — DX: Disorder of thyroid, unspecified: E07.9

## 2012-08-20 MED ORDER — HYDROCODONE-ACETAMINOPHEN 5-325 MG PO TABS
1.0000 | ORAL_TABLET | Freq: Once | ORAL | Status: AC
  Administered 2012-08-21: 1 via ORAL
  Filled 2012-08-20: qty 1

## 2012-08-20 MED ORDER — IBUPROFEN 800 MG PO TABS: 800.0000 mg | ORAL_TABLET | Freq: Three times a day (TID) | ORAL | Status: AC | PRN

## 2012-08-20 NOTE — ED Notes (Addendum)
Right hand and arm pain; shooting up arm starting Monday; numbness; tingling; Reports knot on back of hand. Denies SOB; chest pain; pulse is intact.

## 2012-08-21 NOTE — ED Provider Notes (Signed)
History     CSN: [id number]  Arrival date & time 08/20/12  1944   First MD Initiated Contact with Patient 08/20/12 2254      Chief Complaint  Patient presents with  . Hand Pain    (Consider location/radiation/quality/duration/timing/severity/associated sxs/prior treatment) HPI Comments: Pt to ER c/o right hand pain that radiates up the arm. Pt denies any known injury or penetrating trauma.   Patient is a 56 y.o. female presenting with hand pain. The history is provided by the patient.  Hand Pain This is a new problem. The current episode started in the past 7 days. The problem occurs constantly. The problem has been gradually worsening. Pertinent negatives include no abdominal pain, anorexia, arthralgias, change in bowel habit, chest pain, chills, congestion, coughing, diaphoresis, fatigue, fever, headaches, joint swelling, myalgias, nausea, neck pain, numbness, rash, sore throat, swollen glands, urinary symptoms, vertigo, visual change, vomiting or weakness. She has tried nothing for the symptoms.    Past Medical History  Diagnosis Date  . Thyroid disease     No past surgical history on file.  No family history on file.  History  Substance Use Topics  . Smoking status: Not on file  . Smokeless tobacco: Not on file  . Alcohol Use:     OB History    Grav Para Term Preterm Abortions TAB SAB Ect Mult Living                  Review of Systems  Constitutional: Negative for fever, chills, diaphoresis and fatigue.  HENT: Negative for congestion, sore throat and neck pain.   Respiratory: Negative for cough.   Cardiovascular: Negative for chest pain.  Gastrointestinal: Negative for nausea, vomiting, abdominal pain, anorexia and change in bowel habit.  Musculoskeletal: Negative for myalgias, joint swelling and arthralgias.  Skin: Negative for rash.  Neurological: Negative for vertigo, weakness, numbness and headaches.  All other systems reviewed and are  negative.    Allergies  Review of patient's allergies indicates no known allergies.  Home Medications   Current Outpatient Rx  Name Route Sig Dispense Refill  . METHIMAZOLE 10 MG PO TABS Oral Take 10 mg by mouth 2 (two) times daily.    . IBUPROFEN 800 MG PO TABS Oral Take 1 tablet (800 mg total) by mouth every 8 (eight) hours as needed for pain. 21 tablet 0    BP 145/91  Pulse 83  Temp 98.4 F (36.9 C) (Oral)  Resp 18  SpO2 100%  Physical Exam  Nursing note and vitals reviewed. Constitutional: She appears well-developed and well-nourished. No distress.  HENT:  Head: Normocephalic and atraumatic.  Eyes: Conjunctivae and EOM are normal.  Neck: Normal range of motion. Neck supple.  Cardiovascular:       Intact distal pulses, capillary refill < 3 seconds  Musculoskeletal:       Right wrist: Normal.       Right hand: She exhibits tenderness. She exhibits normal range of motion, no bony tenderness, normal capillary refill, no deformity and no swelling. normal sensation noted. Normal strength noted. She exhibits no finger abduction, no thumb/finger opposition and no wrist extension trouble.       Hands:      All other extremities with normal ROM  Neurological:       No sensory deficit  Skin: She is not diaphoretic.       Skin intact, no tenting    ED Course  Procedures (including critical care time)  Labs Reviewed -  No data to display Dg Hand Complete Right  08/20/2012  *RADIOLOGY REPORT*  Clinical Data: Pain and swelling  RIGHT HAND - COMPLETE 3+ VIEW  Comparison: None.  Findings: There is no evidence for an acute fracture. No subluxation or dislocation.  Joint spaces are preserved.  No worrisome lytic or sclerotic osseous abnormality.  IMPRESSION: No acute bony findings.   Original Report Authenticated By: ERIC A. MANSELL, M.D.      1. Hand tendonitis       MDM  Tendonitis  Pt to ER with dorsal surface right hand pain that radiates up arm onset x 3 days. No known  precipitating incident & no signs of infection. STRICT return precautions discussed for sings of infection. Dc with Ice, splint, and NSAIDs        Jaci Carrel, PA-C 08/21/12 0405

## 2012-08-22 NOTE — ED Provider Notes (Signed)
History/physical exam/procedure(s) were performed by non-physician practitioner and as supervising physician I was immediately available for consultation/collaboration. I have reviewed all notes and am in agreement with care and plan.   Hilario Quarry, MD 08/22/12 [phone]

## 2012-09-04 ENCOUNTER — Emergency Department (HOSPITAL_BASED_OUTPATIENT_CLINIC_OR_DEPARTMENT_OTHER)
Admission: EM | Admit: 2012-09-04 | Discharge: 2012-09-05 | Disposition: A | Discharge status: Home / Self Care | Payer: Self-pay | Attending: Emergency Medicine | Admitting: Emergency Medicine

## 2012-09-04 ENCOUNTER — Encounter (HOSPITAL_BASED_OUTPATIENT_CLINIC_OR_DEPARTMENT_OTHER): Payer: Self-pay | Admitting: *Deleted

## 2012-09-04 DIAGNOSIS — R51 Headache: Secondary | ICD-10-CM | POA: Insufficient documentation

## 2012-09-04 DIAGNOSIS — E079 Disorder of thyroid, unspecified: Secondary | ICD-10-CM | POA: Insufficient documentation

## 2012-09-04 MED ORDER — KETOROLAC TROMETHAMINE 60 MG/2ML IM SOLN
60.0000 mg | Freq: Once | INTRAMUSCULAR | Status: AC
  Administered 2012-09-05: 60 mg via INTRAMUSCULAR
  Filled 2012-09-04: qty 2

## 2012-09-04 NOTE — ED Notes (Signed)
Pt c/o sharp pain to posterior of head

## 2012-09-04 NOTE — ED Provider Notes (Signed)
History     CSN: [id number]  Arrival date & time 09/04/12  2251   First MD Initiated Contact with Patient 09/04/12 2342      Chief Complaint  Patient presents with  . Headache    (Consider location/radiation/quality/duration/timing/severity/associated sxs/prior treatment) HPI Comments: 54 her old female with a history of hyperthyroidism presents with a complaint of headache. This was acute in onset at 4:30 PM, intermittent, sharp and stabbing, located at the left posterior occiput with radiation to the left side of the neck. She denies any associated nausea or vomiting, fevers or chills, stiff neck, numbness or weakness, photophobia or visual changes. She has been able to ambulate without difficulty, she has had no medications prior to arrival. She does have a history of intermittent headaches but they are usually tension-type headaches over the front of her head and not similar to today's headache. Currently her symptoms are mild  Patient is a 56 y.o. female presenting with headaches. The history is provided by the patient and the spouse.  Headache     Past Medical History  Diagnosis Date  . Thyroid disease     Past Surgical History  Procedure Date  . Abdominal hysterectomy     History reviewed. No pertinent family history.  History  Substance Use Topics  . Smoking status: Never Smoker   . Smokeless tobacco: Not on file  . Alcohol Use: No    OB History    Grav Para Term Preterm Abortions TAB SAB Ect Mult Living                  Review of Systems  Neurological: Positive for headaches.  All other systems reviewed and are negative.    Allergies  Review of patient's allergies indicates no known allergies.  Home Medications   Current Outpatient Rx  Name Route Sig Dispense Refill  . METHIMAZOLE 10 MG PO TABS Oral Take 10 mg by mouth 2 (two) times daily.    Marland Kitchen NAPROXEN 500 MG PO TABS Oral Take 1 tablet (500 mg total) by mouth 2 (two) times daily with a meal. 30  tablet 0    BP 126/74  Pulse 72  Temp 97.5 F (36.4 C) (Oral)  Resp 18  Ht 5\' 5"  (1.651 m)  Wt 186 lb (84.369 kg)  BMI 30.95 kg/m2  SpO2 100%  Physical Exam  Nursing note and vitals reviewed. Constitutional: She appears well-developed and well-nourished. No distress.  HENT:  Head: Normocephalic and atraumatic.  Mouth/Throat: Oropharynx is clear and moist. No oropharyngeal exudate.       Isolated small 0.5 cm mobile rubbery lymph node at the base of the scalp on the left  Eyes: Conjunctivae normal and EOM are normal. Pupils are equal, round, and reactive to light. Right eye exhibits no discharge. Left eye exhibits no discharge. No scleral icterus.  Neck: Normal range of motion. Neck supple. No JVD present. No thyromegaly present.  Cardiovascular: Normal rate, regular rhythm, normal heart sounds and intact distal pulses.  Exam reveals no gallop and no friction rub.   No murmur heard. Pulmonary/Chest: Effort normal and breath sounds normal. No respiratory distress. She has no wheezes. She has no rales.  Abdominal: Soft. Bowel sounds are normal. She exhibits no distension and no mass. There is no tenderness.  Musculoskeletal: Normal range of motion. She exhibits no edema and no tenderness.  Lymphadenopathy:    She has no cervical adenopathy.  Neurological: She is alert. Coordination normal.  The patient is alert and oriented x3, moves all extremities, normal gait, normal balance, normal liver controlled without ataxia, speech is clear, cranial nerves III through XII are intact, bilateral grips are equal, sensation to light touch and pinprick of the bilateral upper and lower extremities is normal.  Skin: Skin is warm and dry. No rash noted. No erythema.  Psychiatric: She has a normal mood and affect. Her behavior is normal.    ED Course  Procedures (including critical care time)  Labs Reviewed - No data to display Ct Head Wo Contrast  09/05/2012  *RADIOLOGY REPORT*  Clinical  Data: Headache.  CT HEAD WITHOUT CONTRAST  Technique:  Contiguous axial images were obtained from the base of the skull through the vertex without contrast.  Comparison: None.  Findings: The brain appears normal without evidence of infarct, hemorrhage, mass lesion, mass effect, midline shift or abnormal extra-axial fluid collection.  No hydrocephalus or pneumocephalus. Calvarium is intact.  Imaged paranasal sinuses and mastoid air cells are clear.  IMPRESSION: Normal study.   Original Report Authenticated By: Bernadene Bell. D'ALESSIO, M.D.      1. Headache       MDM  The patient does have a new and different headache which was acute in onset and states that it feels sharp or electric-like. CT scan of the head to rule out hemorrhage, patient has no focal neurologic deficits.  CT scan is reviewed and shows no signs of acute hemorrhage or obvious aneurysm stroke or mass. Patient appears stable, headache is under control with Toradol, symptoms are mild and not. Vital signs are normal, patient informed of results and will be discharged home in improved condition  Discharge Prescriptions include:  Naprosyn       Vida Roller, MD 09/05/12 [phone]

## 2012-09-05 ENCOUNTER — Emergency Department (HOSPITAL_BASED_OUTPATIENT_CLINIC_OR_DEPARTMENT_OTHER): Payer: Self-pay

## 2012-09-05 MED ORDER — NAPROXEN 500 MG PO TABS: 500.0000 mg | ORAL_TABLET | Freq: Two times a day (BID) | ORAL | Status: DC

## 2014-07-09 ENCOUNTER — Other Ambulatory Visit: Payer: Self-pay | Admitting: Family Medicine

## 2014-07-09 DIAGNOSIS — Z1231 Encounter for screening mammogram for malignant neoplasm of breast: Secondary | ICD-10-CM

## 2014-07-19 ENCOUNTER — Ambulatory Visit
Admission: RE | Admit: 2014-07-19 | Discharge: 2014-07-19 | Disposition: A | Discharge status: Home / Self Care | Payer: 59 | Source: Ambulatory Visit | Attending: Family Medicine | Admitting: Family Medicine

## 2014-07-19 ENCOUNTER — Encounter (INDEPENDENT_AMBULATORY_CARE_PROVIDER_SITE_OTHER): Payer: Self-pay

## 2014-07-19 DIAGNOSIS — Z1231 Encounter for screening mammogram for malignant neoplasm of breast: Secondary | ICD-10-CM

## 2015-09-24 ENCOUNTER — Encounter (HOSPITAL_COMMUNITY): Payer: Self-pay | Admitting: Oncology

## 2015-09-24 ENCOUNTER — Emergency Department (HOSPITAL_COMMUNITY)
Admission: EM | Admit: 2015-09-24 | Discharge: 2015-09-25 | Disposition: A | Discharge status: Home / Self Care | Payer: Self-pay | Attending: Emergency Medicine | Admitting: Emergency Medicine

## 2015-09-24 DIAGNOSIS — Z87891 Personal history of nicotine dependence: Secondary | ICD-10-CM | POA: Insufficient documentation

## 2015-09-24 DIAGNOSIS — R0602 Shortness of breath: Secondary | ICD-10-CM | POA: Insufficient documentation

## 2015-09-24 DIAGNOSIS — R05 Cough: Secondary | ICD-10-CM | POA: Insufficient documentation

## 2015-09-24 DIAGNOSIS — M542 Cervicalgia: Secondary | ICD-10-CM | POA: Insufficient documentation

## 2015-09-24 DIAGNOSIS — Z8639 Personal history of other endocrine, nutritional and metabolic disease: Secondary | ICD-10-CM | POA: Insufficient documentation

## 2015-09-24 DIAGNOSIS — Z79899 Other long term (current) drug therapy: Secondary | ICD-10-CM | POA: Insufficient documentation

## 2015-09-24 DIAGNOSIS — R059 Cough, unspecified: Secondary | ICD-10-CM

## 2015-09-24 DIAGNOSIS — R0982 Postnasal drip: Secondary | ICD-10-CM | POA: Insufficient documentation

## 2015-09-24 DIAGNOSIS — R062 Wheezing: Secondary | ICD-10-CM | POA: Insufficient documentation

## 2015-09-24 MED ORDER — ALBUTEROL SULFATE (2.5 MG/3ML) 0.083% IN NEBU
5.0000 mg | INHALATION_SOLUTION | Freq: Once | RESPIRATORY_TRACT | Status: AC
  Administered 2015-09-25: 5 mg via RESPIRATORY_TRACT
  Filled 2015-09-24: qty 6

## 2015-09-24 MED ORDER — PREDNISONE 20 MG PO TABS
60.0000 mg | ORAL_TABLET | Freq: Once | ORAL | Status: AC
  Administered 2015-09-25: 60 mg via ORAL
  Filled 2015-09-24: qty 3

## 2015-09-24 MED ORDER — HYDROCODONE-HOMATROPINE 5-1.5 MG/5ML PO SYRP
5.0000 mL | ORAL_SOLUTION | Freq: Once | ORAL | Status: AC
  Administered 2015-09-25: 5 mL via ORAL
  Filled 2015-09-24: qty 5

## 2015-09-24 MED ORDER — IPRATROPIUM BROMIDE 0.02 % IN SOLN
0.5000 mg | Freq: Once | RESPIRATORY_TRACT | Status: AC
  Administered 2015-09-25: 0.5 mg via RESPIRATORY_TRACT
  Filled 2015-09-24: qty 2.5

## 2015-09-24 NOTE — ED Provider Notes (Signed)
CSN: [id number]     Arrival date & time 09/24/15  2335 History  By signing my name below, I, Angel Holt, attest that this documentation has been prepared under the direction and in the presence of [employer], PA-C. Electronically Signed: [name], ED Scribe. 09/24/2015. 11:51 PM.    Chief Complaint  Patient presents with  . Cough   The history is provided by the patient. No language interpreter was used.   HPI Comments: Angel Holt is a 59 y.o. female who presents to the Emergency Department complaining of intermittent gradually worsening moderate productive cough onset 3 weeks ago. She reports associated intermittent wheezing, SOB, postnasal drip, and mild left neck pain onset a couple of days ago. She denies any fevers, nasal congestion, or rhinorrhea. She reports that she has been taking Delsym and Mucinex with no relief. She denies any sick contacts but adds that her grandchild had a cold during the beginning of onset.   Past Medical History  Diagnosis Date  . Thyroid disease    Past Surgical History  Procedure Laterality Date  . Abdominal hysterectomy     No family history on file. Social History  Substance Use Topics  . Smoking status: Former Games developer  . Smokeless tobacco: Never Used  . Alcohol Use: No   OB History    No data available      Review of Systems  Constitutional: Negative for fever and chills.  HENT: Positive for postnasal drip. Negative for congestion and rhinorrhea.   Respiratory: Positive for cough, shortness of breath and wheezing.   Musculoskeletal: Positive for neck pain.  All other systems reviewed and are negative.   Allergies  Review of patient's allergies indicates no known allergies.  Home Medications   Prior to Admission medications   Medication Sig Start Date End Date Taking? Authorizing Provider  Multiple Vitamin (MULTIVITAMIN WITH MINERALS) TABS tablet Take 1 tablet by mouth daily.   Yes Historical Provider, MD   Omega-3 Fatty Acids (MINI FISH OIL PO) Take 1 capsule by mouth daily.   Yes Historical Provider, MD  sodium chloride (OCEAN) 0.65 % SOLN nasal spray Place 1 spray into both nostrils 3 (three) times daily as needed for congestion.   Yes Historical Provider, MD  benzonatate (TESSALON) 100 MG capsule Take 1 capsule (100 mg total) by mouth 3 (three) times daily as needed for cough. 09/25/15   Antony Madura, PA-C  HYDROcodone-homatropine (HYCODAN) 5-1.5 MG/5ML syrup Take 5 mLs by mouth every 8 (eight) hours as needed for cough. 09/25/15   Antony Madura, PA-C  predniSONE (DELTASONE) 20 MG tablet Take 2 tablets (40 mg total) by mouth daily. 09/25/15   Antony Madura, PA-C   BP 148/76 mmHg  Pulse 71  Temp(Src) 98 F (36.7 C) (Oral)  Resp 20  Ht  (1.651 m)  Wt 203 lb (92.08 kg)  BMI 33.78 kg/m2  SpO2 96%   Physical Exam  Constitutional: She is oriented to person, place, and time. She appears well-developed and well-nourished. No distress.  Nontoxic/nonseptic appearing  HENT:  Head: Normocephalic and atraumatic.  Eyes: Conjunctivae and EOM are normal. No scleral icterus.  Neck: Normal range of motion.  Cardiovascular: Normal rate, regular rhythm and intact distal pulses.   Pulmonary/Chest: Effort normal. No respiratory distress. She has wheezes. She has no rales.  Faint expiratory wheeze in b/l bases. Chest expansion symmetric. Lungs otherwise clear. No rales or rhonchi. Congested cough appreciated at bedside.  Musculoskeletal: Normal range of motion.  Neurological: She  is alert and oriented to person, place, and time. She exhibits normal muscle tone. Coordination normal.  GCS 15. Patient moving all extremities.  Skin: Skin is warm and dry. No rash noted. She is not diaphoretic. No erythema. No pallor.  Psychiatric: She has a normal mood and affect. Her behavior is normal.  Nursing note and vitals reviewed.   ED Course  Procedures (including critical care time) DIAGNOSTIC STUDIES: Oxygen  Saturation is 97% on RA, adequate by my interpretation.    COORDINATION OF CARE: 11:50 PM- Pt advised of plan for treatment and pt agrees.    Labs Review Labs Reviewed - No data to display  Imaging Review Dg Chest 2 View  09/25/2015   CLINICAL DATA:  Subacute onset of worsening productive cough, intermittent wheezing, shortness of breath, postnasal drip and mild left-sided neck pain. Initial encounter.  EXAM: CHEST  2 VIEW  COMPARISON:  Chest radiograph performed 03/26/2011  FINDINGS: The lungs are well-aerated and clear. There is no evidence of focal opacification, pleural effusion or pneumothorax.  The heart is normal in size; the mediastinal contour is within normal limits. No acute osseous abnormalities are seen.  IMPRESSION: No acute cardiopulmonary process seen.   Electronically Signed   By: Roanna Raider M.D.   On: 09/25/2015 00:45     Antony Madura, PA-C has personally reviewed and evaluated these images and lab results as part of her medical decision-making.   EKG Interpretation None      MDM   Final diagnoses:  Cough    59 year old female presents to the emergency department for evaluation of cough. Cough is congested in nature and has been associated with shortness of breath and wheezing. Patient reports that symptoms have been persistent over the last 3 weeks and associated, initially, with upper respiratory symptoms. Cough likely secondary to resolving URI. X-ray negative for pneumonia. Patient is afebrile and well-appearing. She has had improvement in her symptoms with oral steroids, DuoNeb, and Hycodan. Will discharge with supportive treatment including albuterol inhaler, 5 day burst of prednisone, and Hycodan prescription. Primary careful advised and return precautions given. Patient agreeable to plan with no unaddressed concerns. Patient discharged in good condition.  I, [name], personally performed the services described in this documentation. All medical record  entries made by the scribe were at my direction and in my presence.  I have reviewed the chart and discharge instructions and agree that the record reflects my personal performance and is accurate and complete. Eriverto Byrnes.  09/25/2015. 5:59 AM.    Filed Vitals:   09/24/15 2338 09/25/15 0135  BP: 159/109 148/76  Pulse: 72 71  Temp: 97.7 F (36.5 C) 98 F (36.7 C)  TempSrc: Oral   Resp: 20   Height:  (1.651 m)   Weight: 203 lb (92.08 kg)   SpO2: 97% 96%     Antony Madura, PA-C 09/25/15 0601  April Palumbo, MD 09/25/15 [phone]

## 2015-09-24 NOTE — ED Notes (Signed)
Pt presents d/t cough, congestion and neck/shoulder pain x 3 weeks.

## 2015-09-25 ENCOUNTER — Emergency Department (HOSPITAL_COMMUNITY): Payer: Self-pay

## 2015-09-25 MED ORDER — ALBUTEROL SULFATE HFA 108 (90 BASE) MCG/ACT IN AERS
2.0000 | INHALATION_SPRAY | Freq: Once | RESPIRATORY_TRACT | Status: AC
  Administered 2015-09-25: 2 via RESPIRATORY_TRACT
  Filled 2015-09-25: qty 6.7

## 2015-09-25 MED ORDER — BENZONATATE 100 MG PO CAPS: 100.0000 mg | ORAL_CAPSULE | Freq: Three times a day (TID) | ORAL | Status: DC | PRN

## 2015-09-25 MED ORDER — HYDROCODONE-HOMATROPINE 5-1.5 MG/5ML PO SYRP: 5.0000 mL | ORAL_SOLUTION | Freq: Three times a day (TID) | ORAL | Status: DC | PRN

## 2015-09-25 MED ORDER — PREDNISONE 20 MG PO TABS: 40.0000 mg | ORAL_TABLET | Freq: Every day | ORAL | Status: DC

## 2015-09-25 NOTE — Discharge Instructions (Signed)
Take prednisone as prescribed. Use an albuterol inhaler, 2 puffs every 4-6 hours as needed for cough and shortness of breath. Take Tessalon tablets as needed for cough as well. You may use Hycodan if Tessalon does not control your cough. Do not drive or drink alcohol after taking Hycodan as it may make you drowsy and impair your judgment. Follow-up with a primary care doctor for a recheck of symptoms.  Cough, Adult Coughing is a reflex that clears your throat and your airways. Coughing helps to heal and protect your lungs. It is normal to cough occasionally, but a cough that happens with other symptoms or lasts a long time may be a sign of a condition that needs treatment. A cough may last only 2-3 weeks (acute), or it may last longer than 8 weeks (chronic). CAUSES Coughing is commonly caused by:  Breathing in substances that irritate your lungs.  A viral or bacterial respiratory infection.  Allergies.  Asthma.  Postnasal drip.  Smoking.  Acid backing up from the stomach into the esophagus (gastroesophageal reflux).  Certain medicines.  Chronic lung problems, including COPD (or rarely, lung cancer).  Other medical conditions such as heart failure. HOME CARE INSTRUCTIONS  Pay attention to any changes in your symptoms. Take these actions to help with your discomfort:  Take medicines only as told by your health care provider.  If you were prescribed an antibiotic medicine, take it as told by your health care provider. Do not stop taking the antibiotic even if you start to feel better.  Talk with your health care provider before you take a cough suppressant medicine.  Drink enough fluid to keep your urine clear or pale yellow.  If the air is dry, use a cold steam vaporizer or humidifier in your bedroom or your home to help loosen secretions.  Avoid anything that causes you to cough at work or at home.  If your cough is worse at night, try sleeping in a semi-upright  position.  Avoid cigarette smoke. If you smoke, quit smoking. If you need help quitting, ask your health care provider.  Avoid caffeine.  Avoid alcohol.  Rest as needed. SEEK MEDICAL CARE IF:   You have new symptoms.  You cough up pus.  Your cough does not get better after 2-3 weeks, or your cough gets worse.  You cannot control your cough with suppressant medicines and you are losing sleep.  You develop pain that is getting worse or pain that is not controlled with pain medicines.  You have a fever.  You have unexplained weight loss.  You have night sweats. SEEK IMMEDIATE MEDICAL CARE IF:  You cough up blood.  You have difficulty breathing.  Your heartbeat is very fast.   This information is not intended to replace advice given to you by your health care provider. Make sure you discuss any questions you have with your health care provider.   Document Released: 06/01/2011 Document Revised: 08/24/2015 Document Reviewed: 02/09/2015 Elsevier Interactive Patient Education [employer]

## 2018-07-02 ENCOUNTER — Emergency Department (HOSPITAL_BASED_OUTPATIENT_CLINIC_OR_DEPARTMENT_OTHER)
Admission: EM | Admit: 2018-07-02 | Discharge: 2018-07-02 | Disposition: A | Discharge status: Home / Self Care | Payer: Non-veteran care | Attending: Emergency Medicine | Admitting: Emergency Medicine

## 2018-07-02 ENCOUNTER — Other Ambulatory Visit: Payer: Self-pay

## 2018-07-02 ENCOUNTER — Encounter (HOSPITAL_BASED_OUTPATIENT_CLINIC_OR_DEPARTMENT_OTHER): Payer: Self-pay

## 2018-07-02 DIAGNOSIS — Z79899 Other long term (current) drug therapy: Secondary | ICD-10-CM | POA: Diagnosis not present

## 2018-07-02 DIAGNOSIS — R070 Pain in throat: Secondary | ICD-10-CM | POA: Diagnosis present

## 2018-07-02 DIAGNOSIS — Z87891 Personal history of nicotine dependence: Secondary | ICD-10-CM | POA: Diagnosis not present

## 2018-07-02 DIAGNOSIS — H9209 Otalgia, unspecified ear: Secondary | ICD-10-CM | POA: Diagnosis not present

## 2018-07-02 DIAGNOSIS — J069 Acute upper respiratory infection, unspecified: Secondary | ICD-10-CM | POA: Insufficient documentation

## 2018-07-02 LAB — RAPID STREP SCREEN (MED CTR MEBANE ONLY): Streptococcus, Group A Screen (Direct): NEGATIVE

## 2018-07-02 MED ORDER — KETOROLAC TROMETHAMINE 30 MG/ML IJ SOLN
30.0000 mg | Freq: Once | INTRAMUSCULAR | Status: AC
  Administered 2018-07-02: 30 mg via INTRAMUSCULAR
  Filled 2018-07-02: qty 1

## 2018-07-02 MED ORDER — LORATADINE 10 MG PO TABS
10.0000 mg | ORAL_TABLET | Freq: Once | ORAL | Status: AC
  Administered 2018-07-02: 10 mg via ORAL
  Filled 2018-07-02: qty 1

## 2018-07-02 MED ORDER — LORATADINE 10 MG PO TABS: 10.0000 mg | ORAL_TABLET | Freq: Every day | ORAL | 0 refills | Status: DC

## 2018-07-02 MED ORDER — OXYMETAZOLINE HCL 0.05 % NA SOLN
1.0000 | Freq: Once | NASAL | Status: AC
  Administered 2018-07-02: 1 via NASAL
  Filled 2018-07-02: qty 15

## 2018-07-02 NOTE — ED Provider Notes (Signed)
[facility] EMERGENCY DEPARTMENT Provider Note   CSN: [id number] Arrival date & time: 07/02/18  2025     History   Chief Complaint Chief Complaint  Patient presents with  . Cough    HPI Angel Holt is a 62 y.o. female.  Pt presents to the ED today with sore throat and ear pain for the past 2 weeks.  She said she got it from her husband.  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  She denies f/c.  Very little cough.     Past Medical History:  Diagnosis Date  . Thyroid disease     Patient Active Problem List   Diagnosis Date Noted  . PANIC DISORDER 12/30/2008  . CARPAL TUNNEL SYNDROME, BILATERAL, HX OF 12/30/2008    Past Surgical History:  Procedure Laterality Date  . ABDOMINAL HYSTERECTOMY       OB History   None      Home Medications    Prior to Admission medications   Medication Sig Start Date End Date Taking? Authorizing Provider  benzonatate (TESSALON) 100 MG capsule Take 1 capsule (100 mg total) by mouth 3 (three) times daily as needed for cough. 09/25/15   Antony MaduraHumes, Kelly, PA-C  HYDROcodone-homatropine (HYCODAN) 5-1.5 MG/5ML syrup Take 5 mLs by mouth every 8 (eight) hours as needed for cough. 09/25/15   Antony MaduraHumes, Kelly, PA-C  loratadine (CLARITIN) 10 MG tablet Take 1 tablet (10 mg total) by mouth daily. 07/02/18   Jacalyn LefevreHaviland, Emy Angevine, MD  Multiple Vitamin (MULTIVITAMIN WITH MINERALS) TABS tablet Take 1 tablet by mouth daily.    [provider]  Omega-3 Fatty Acids (MINI FISH OIL PO) Take 1 capsule by mouth daily.    [provider]  predniSONE (DELTASONE) 20 MG tablet Take 2 tablets (40 mg total) by mouth daily. 09/25/15   Antony MaduraHumes, Kelly, PA-C  sodium chloride (OCEAN) 0.65 % SOLN nasal spray Place 1 spray into both nostrils 3 (three) times daily as needed for congestion.    [provider]    Family History No family history on file.  Social History Social History   Tobacco  Use  . Smoking status: Former Games developermoker  . Smokeless tobacco: Never Used  Substance Use Topics  . Alcohol use: No  . Drug use: No     Allergies   Patient has no known allergies.   Review of Systems Review of Systems  HENT: Positive for congestion and sore throat.   All other systems reviewed and are negative.    Physical Exam Updated Vital Signs BP (!) 151/102 (BP Location: Left Arm)   Pulse 65   Temp 98.6 F (37 C) (Oral)   Resp 18   Ht 5\' 5"  (1.651 m)   Wt 93 kg (205 lb)   SpO2 99%   BMI 34.11 kg/m   Physical Exam  Constitutional: She is oriented to person, place, and time. She appears well-developed and well-nourished.  HENT:  Head: Normocephalic and atraumatic.  Right Ear: External ear normal.  Left Ear: External ear normal.  Nose: Nose normal.  Mouth/Throat: Oropharynx is clear and moist.  Eyes: Pupils are equal, round, and reactive to light. Conjunctivae and EOM are normal.  Neck: Normal range of motion. Neck supple.  Cardiovascular: Normal rate, regular rhythm, normal heart sounds and intact distal pulses.  Pulmonary/Chest: Effort normal and breath sounds normal.  Abdominal: Soft. Bowel sounds are normal.  Musculoskeletal: Normal range of motion.  Neurological: She is alert and oriented to person, place, and time.  Skin: Skin is warm. Capillary refill takes less than 2 seconds.  Psychiatric: She has a normal mood and affect. Her behavior is normal. Judgment and thought content normal.  Nursing note and vitals reviewed.    ED Treatments / Results  Labs (all labs ordered are listed, but only abnormal results are displayed) Labs Reviewed  RAPID STREP SCREEN ([facility] ONLY)  CULTURE, GROUP A STREP [facility])    EKG None  Radiology No results found.  Procedures Procedures (including critical care time)  Medications Ordered in ED Medications  oxymetazoline (AFRIN) 0.05 % nasal spray 1 spray (has no administration in time range)  loratadine  (CLARITIN) tablet 10 mg (has no administration in time range)  ketorolac (TORADOL) 30 MG/ML injection 30 mg (30 mg Intramuscular Given 07/02/18 2054)     Initial Impression / Assessment and Plan / ED Course  I have reviewed the triage vital signs and the nursing notes.  Pertinent labs & imaging results that were available during my care of the patient were reviewed by me and considered in my medical decision making (see chart for details).    Sore throat and ear pain likely due to viral URI.  Pt will be given afrin and will be put on claritin.  Return if worse and f/u with pcp.  Final Clinical Impressions(s) / ED Diagnoses   Final diagnoses:  Viral upper respiratory tract infection    ED Discharge Orders        Ordered    loratadine (CLARITIN) 10 MG tablet  Daily     07/02/18 2123       Jacalyn Lefevre, MD 07/02/18 2125

## 2018-07-02 NOTE — ED Triage Notes (Signed)
C/o flu like sx x 2 weeks-NAD-steady gait 

## 2018-07-02 NOTE — ED Notes (Signed)
Pt verbalizes understanding of d/c instructions and denies any further needs at this time. 

## 2018-07-05 LAB — CULTURE, GROUP A STREP (THRC)

## 2019-05-04 ENCOUNTER — Encounter (HOSPITAL_COMMUNITY): Payer: Self-pay

## 2019-05-04 ENCOUNTER — Ambulatory Visit (HOSPITAL_COMMUNITY)
Admission: EM | Admit: 2019-05-04 | Discharge: 2019-05-04 | Disposition: A | Discharge status: Home / Self Care | Payer: Non-veteran care | Attending: Family Medicine | Admitting: Family Medicine

## 2019-05-04 ENCOUNTER — Other Ambulatory Visit: Payer: Self-pay

## 2019-05-04 DIAGNOSIS — L720 Epidermal cyst: Secondary | ICD-10-CM

## 2019-05-04 DIAGNOSIS — M79601 Pain in right arm: Secondary | ICD-10-CM | POA: Diagnosis not present

## 2019-05-04 DIAGNOSIS — K219 Gastro-esophageal reflux disease without esophagitis: Secondary | ICD-10-CM

## 2019-05-04 NOTE — ED Triage Notes (Signed)
Patient presents to Urgent Care with complaints of right arm pain where she has a small lump since December 2019. Patient reports it has been bothering her more recently, and she has been having intermittent lateral back pains with inspiration. Pt states she has never had a blood clot before, but her mother died of a pulmonary embolism. Pt denies SOB or CP.

## 2019-05-04 NOTE — ED Provider Notes (Signed)
[facility]    CSN: [id number] Arrival date & time: 05/04/19  1130     History   Chief Complaint Chief Complaint  Patient presents with  . Arm Pain    HPI Angel Holt is a 63 y.o. female.   HPI  Patient has a couple of complaints.  First she has a "lump" in her right arm.  She states she thinks it is making her arm feel heavy.  She keeps rubbing on this lump.  She states her mother died of a blood clot.  She is worried that she has a blood clot.  She does not smoke, she does not have any risk factors, she does not have any swelling.  The lump is been there for many months. No other complaints except for epigastric pain.  Radiates to her shoulder blade.  Comes and goes.  She thinks it is "acid".  Not related to meals although spicy foods can make it worse.  She does not drink alcohol.  She does not take anti-inflammatories.  Past Medical History:  Diagnosis Date  . Thyroid disease     Patient Active Problem List   Diagnosis Date Noted  . PANIC DISORDER 12/30/2008  . CARPAL TUNNEL SYNDROME, BILATERAL, HX OF 12/30/2008    Past Surgical History:  Procedure Laterality Date  . ABDOMINAL HYSTERECTOMY      OB History   No obstetric history on file.      Home Medications    Prior to Admission medications   Medication Sig Start Date End Date Taking? Authorizing Provider  loratadine (CLARITIN) 10 MG tablet Take 1 tablet (10 mg total) by mouth daily. 07/02/18   Jacalyn LefevreHaviland, Julie, MD  Multiple Vitamin (MULTIVITAMIN WITH MINERALS) TABS tablet Take 1 tablet by mouth daily.    [provider]  Omega-3 Fatty Acids (MINI FISH OIL PO) Take 1 capsule by mouth daily.    [provider]  sodium chloride (OCEAN) 0.65 % SOLN nasal spray Place 1 spray into both nostrils 3 (three) times daily as needed for congestion.    [provider]    Family History History reviewed. No pertinent family history.  Social History Social History   Tobacco Use   . Smoking status: Former Games developermoker  . Smokeless tobacco: Never Used  Substance Use Topics  . Alcohol use: No  . Drug use: No     Allergies   Patient has no known allergies.   Review of Systems Review of Systems  Constitutional: Negative for chills and fever.  HENT: Negative for ear pain and sore throat.   Eyes: Negative for pain and visual disturbance.  Respiratory: Negative for cough and shortness of breath.   Cardiovascular: Negative for chest pain and palpitations.  Gastrointestinal: Positive for abdominal pain. Negative for vomiting.  Genitourinary: Negative for dysuria and hematuria.  Musculoskeletal: Negative for arthralgias and back pain.  Skin: Negative for color change and rash.  Neurological: Negative for seizures and syncope.  All other systems reviewed and are negative.    Physical Exam Triage Vital Signs ED Triage Vitals  Enc Vitals Group     BP 05/04/19 1234 130/85     Pulse Rate 05/04/19 1234 73     Resp 05/04/19 1234 18     Temp 05/04/19 1234 98 F (36.7 C)     Temp Source 05/04/19 1234 Oral     SpO2 05/04/19 1234 99 %     Weight --      Height --  Head Circumference --      Peak Flow --      Pain Score 05/04/19 1247 4     Pain Loc --      Pain Edu? --      Excl. in GC? --    No data found.  Updated Vital Signs BP 130/85 (BP Location: Left Arm)   Pulse 73   Temp 98 F (36.7 C) (Oral)   Resp 18   SpO2 99%      Physical Exam Constitutional:      General: She is not in acute distress.    Appearance: She is well-developed.  HENT:     Head: Normocephalic and atraumatic.  Eyes:     Conjunctiva/sclera: Conjunctivae normal.     Pupils: Pupils are equal, round, and reactive to light.  Neck:     Musculoskeletal: Normal range of motion.  Cardiovascular:     Rate and Rhythm: Normal rate and regular rhythm.     Heart sounds: Normal heart sounds.  Pulmonary:     Effort: Pulmonary effort is normal. No respiratory distress.     Breath  sounds: Normal breath sounds.  Abdominal:     General: There is no distension.     Palpations: Abdomen is soft.     Tenderness: There is abdominal tenderness.     Comments: Mild midepigastric tenderness.  No guarding or rebound.  No organomegaly  Musculoskeletal: Normal range of motion.  Lymphadenopathy:     Cervical: No cervical adenopathy.  Skin:    General: Skin is warm and dry.     Comments: On the inner bicep region there is a palpable 1 to 1-1/2 cm subcutaneous nodule.  It feels like an inclusion cyst.  It is mobile.  Nontender.  There is no swelling.  Pulses are normal in the wrist and hand  Neurological:     General: No focal deficit present.     Mental Status: She is alert and oriented to person, place, and time.  Psychiatric:        Mood and Affect: Mood normal.      UC Treatments / Results  Labs (all labs ordered are listed, but only abnormal results are displayed) Labs Reviewed - No data to display  EKG None  Radiology No results found.  Procedures Procedures (including critical care time)  Medications Ordered in UC Medications - No data to display  Initial Impression / Assessment and Plan / UC Course  I have reviewed the triage vital signs and the nursing notes.  Pertinent labs & imaging results that were available during my care of the patient were reviewed by me and considered in my medical decision making (see chart for details).     Reassured patient that the superficial nodule in her arm is not a clot.  Upper extremity clots are quite uncommon.  No why she would be feeling discomfort in her arm unless she is just so focused on this nodule that she thinks it is causing more problems then actual. The GERD is not surprising giving her stress being out of work with a COVID-19.  We discussed management with oral agents over-the-counter. Final Clinical Impressions(s) / UC Diagnoses   Final diagnoses:  Right arm pain  Gastroesophageal reflux disease,  esophagitis presence not specified  Inclusion cyst     Discharge Instructions     The arm bump is nothing to worry about Your exam is normal I think you have some acid reflux causing abdominal pain Try  tums or antacid , if severe get some omeprazole Follow up with a PCP   ED Prescriptions    None     Controlled Substance Prescriptions [place] Controlled Substance Registry consulted? Not Applicable   Eustace Moore, MD 05/04/19 [phone]

## 2019-05-04 NOTE — Discharge Instructions (Signed)
The arm bump is nothing to worry about Your exam is normal I think you have some acid reflux causing abdominal pain Try tums or antacid , if severe get some omeprazole Follow up with a PCP

## 2019-09-19 ENCOUNTER — Encounter (HOSPITAL_COMMUNITY): Payer: Self-pay | Admitting: *Deleted

## 2019-09-19 ENCOUNTER — Other Ambulatory Visit: Payer: Self-pay

## 2019-09-19 ENCOUNTER — Emergency Department (HOSPITAL_COMMUNITY)

## 2019-09-19 ENCOUNTER — Emergency Department (HOSPITAL_COMMUNITY)
Admission: EM | Admit: 2019-09-19 | Discharge: 2019-09-19 | Disposition: A | Discharge status: Home / Self Care | Attending: Emergency Medicine | Admitting: Emergency Medicine

## 2019-09-19 DIAGNOSIS — E079 Disorder of thyroid, unspecified: Secondary | ICD-10-CM | POA: Diagnosis not present

## 2019-09-19 DIAGNOSIS — Z87891 Personal history of nicotine dependence: Secondary | ICD-10-CM | POA: Insufficient documentation

## 2019-09-19 DIAGNOSIS — R0789 Other chest pain: Secondary | ICD-10-CM | POA: Diagnosis present

## 2019-09-19 LAB — BASIC METABOLIC PANEL
Anion gap: 5 (ref 5–15)
BUN: 13 mg/dL (ref 8–23)
CO2: 30 mmol/L (ref 22–32)
Calcium: 9.5 mg/dL (ref 8.9–10.3)
Chloride: 107 mmol/L (ref 98–111)
Creatinine, Ser: 0.8 mg/dL (ref 0.44–1.00)
GFR calc Af Amer: >60 mL/min (ref >60)
GFR calc non Af Amer: >60 mL/min (ref >60)
Glucose, Bld: 93 mg/dL (ref 70–99)
Potassium: 4 mmol/L (ref 3.5–5.1)
Sodium: 142 mmol/L (ref 135–145)

## 2019-09-19 LAB — CBC
HCT: 40.3 % (ref 36.0–46.0)
Hemoglobin: 13.2 g/dL (ref 12.0–15.0)
MCH: 31.2 pg (ref 26.0–34.0)
MCHC: 32.8 g/dL (ref 30.0–36.0)
MCV: 95.3 fL (ref 80.0–100.0)
Platelets: 265 10*3/uL (ref 150–400)
RBC: 4.23 MIL/uL (ref 3.87–5.11)
RDW: 12.8 % (ref 11.5–15.5)
WBC: 7.9 10*3/uL (ref 4.0–10.5)
nRBC: 0 % (ref 0.0–0.2)

## 2019-09-19 LAB — I-STAT BETA HCG BLOOD, ED (MC, WL, AP ONLY): I-stat hCG, quantitative: <5 m[IU]/mL (ref <5)

## 2019-09-19 LAB — TROPONIN I (HIGH SENSITIVITY)
Troponin I (High Sensitivity): <2 ng/L (ref <18)
Troponin I (High Sensitivity): 3 ng/L (ref <18)

## 2019-09-19 MED ORDER — IBUPROFEN 400 MG PO TABS: 400.0000 mg | ORAL_TABLET | Freq: Three times a day (TID) | ORAL | 0 refills | Status: AC

## 2019-09-19 MED ORDER — IBUPROFEN 400 MG PO TABS
400.0000 mg | ORAL_TABLET | Freq: Once | ORAL | Status: AC
  Administered 2019-09-19: 09:00:00 400 mg via ORAL
  Filled 2019-09-19: qty 1

## 2019-09-19 MED ORDER — OMEPRAZOLE 20 MG PO CPDR: 20.0000 mg | DELAYED_RELEASE_CAPSULE | Freq: Every day | ORAL | 0 refills | Status: DC

## 2019-09-19 MED ORDER — SODIUM CHLORIDE 0.9% FLUSH: 3.0000 mL | Freq: Once | INTRAVENOUS | Status: DC

## 2019-09-19 NOTE — Discharge Instructions (Signed)
As discussed, today's evaluation has been reassuring. Your vital signs, labs and x-ray are all unremarkable. However, if you do not improve in the next 3 days in spite of your medication, please be sure to follow-up with your physician or return here.  Otherwise, please follow-up with your physician for appropriate ongoing care.

## 2019-09-19 NOTE — ED Notes (Signed)
Patient verbalizes understanding of discharge instructions. Opportunity for questioning and answers were provided. Pt discharged from ED. 

## 2019-09-19 NOTE — ED Provider Notes (Signed)
[facility] Provider Note   CSN: [id number] Arrival date & time: 09/19/19  0105     History   Chief Complaint Chief Complaint  Patient presents with  . Chest Pain    HPI Angel Holt is a 63 y.o. female.     HPI Patient presents with concern of pain in her left thorax. It seems as though the patient has both pressure sensation in her left upper shoulder, arm, and pain about the left inferior breast area. The former is more pressure-like, heavy sensation, without distal dysesthesia or weakness.  The latter is sharp, radiates from the left inferior mammary area inferiorly, to the axilla. This second area of pain is more prominent with supine positioning. No clear precipitant, prior to onset which was about 1 week ago. Since that time the pain is been intermittent, is currently minimal. She has not taken any medication regularly for pain control, and aside from positioning, no clear alleviating or exacerbating factors, but no exertional or pleuritic changes. Patient states that she is generally well, denies chronic medical issues. Past Medical History:  Diagnosis Date  . Thyroid disease     Patient Active Problem List   Diagnosis Date Noted  . PANIC DISORDER 12/30/2008  . CARPAL TUNNEL SYNDROME, BILATERAL, HX OF 12/30/2008    Past Surgical History:  Procedure Laterality Date  . ABDOMINAL HYSTERECTOMY       OB History   No obstetric history on file.      Home Medications    Prior to Admission medications   Medication Sig Start Date End Date Taking? Authorizing Provider  ibuprofen (ADVIL) 400 MG tablet Take 1 tablet (400 mg total) by mouth 3 (three) times daily for 3 days. Take one tablet three times daily for three days 09/19/19 09/22/19  Carmin Muskrat, MD  loratadine (CLARITIN) 10 MG tablet Take 1 tablet (10 mg total) by mouth daily. 07/02/18   Isla Pence, MD  Multiple Vitamin (MULTIVITAMIN WITH MINERALS) TABS tablet  Take 1 tablet by mouth daily.    [provider]  Omega-3 Fatty Acids (MINI FISH OIL PO) Take 1 capsule by mouth daily.    [provider]  omeprazole (PRILOSEC) 20 MG capsule Take 1 capsule (20 mg total) by mouth daily. Take one tablet daily 09/19/19   Carmin Muskrat, MD  sodium chloride (OCEAN) 0.65 % SOLN nasal spray Place 1 spray into both nostrils 3 (three) times daily as needed for congestion.    [provider]    Family History No family history on file.  Social History Social History   Tobacco Use  . Smoking status: Former Research scientist (life sciences)  . Smokeless tobacco: Never Used  Substance Use Topics  . Alcohol use: No  . Drug use: No     Allergies   Patient has no known allergies.   Review of Systems Review of Systems  Constitutional:       Per HPI, otherwise negative  HENT:       Per HPI, otherwise negative  Respiratory:       Per HPI, otherwise negative  Cardiovascular:       Per HPI, otherwise negative  Gastrointestinal: Negative for vomiting.  Endocrine:       Negative aside from HPI  Genitourinary:       Neg aside from HPI   Musculoskeletal:       Per HPI, otherwise negative  Skin: Negative.   Neurological: Negative for syncope.     Physical  Exam Updated Vital Signs BP 117/81   Pulse 70   Temp 98.3 F (36.8 C) (Oral)   Resp 19   Ht 5' 5.5" (1.664 m)   Wt 97.5 kg   SpO2 97%   BMI 35.23 kg/m   Physical Exam Vitals signs and nursing note reviewed.  Constitutional:      General: She is not in acute distress.    Appearance: She is well-developed.  HENT:     Head: Normocephalic and atraumatic.  Eyes:     Conjunctiva/sclera: Conjunctivae normal.  Cardiovascular:     Rate and Rhythm: Normal rate and regular rhythm.     Pulses:          Radial pulses are 3+ on the right side and 3+ on the left side.  Pulmonary:     Effort: Pulmonary effort is normal. No respiratory distress.     Breath sounds: Normal breath sounds. No  stridor.  Abdominal:     General: There is no distension.  Musculoskeletal:     Comments: No appreciable edema, deformities anywhere.  There is tenderness to palpation in the left trapezius area, none on the right.  Skin:    General: Skin is warm and dry.  Neurological:     Mental Status: She is alert and oriented to person, place, and time.     Cranial Nerves: No cranial nerve deficit.      ED Treatments / Results  Labs (all labs ordered are listed, but only abnormal results are displayed) Labs Reviewed  BASIC METABOLIC PANEL  CBC  I-STAT BETA HCG BLOOD, ED ([facility], [facility], [facility] ONLY)  TROPONIN I (HIGH SENSITIVITY)  TROPONIN I (HIGH SENSITIVITY)    EKG EKG Interpretation  Date/Time:  Saturday September 19 2019 01:17:41 EDT Ventricular Rate:  64 PR Interval:  172 QRS Duration: 84 QT Interval:  406 QTC Calculation: 418 R Axis:   4 Text Interpretation:  Normal sinus rhythm Normal ECG No significant change since last tracing Confirmed by Zadie Rhine ([id number]) on 09/19/2019 1:35:05 AM   Radiology Dg Chest 2 View  Result Date: 09/19/2019 CLINICAL DATA:  Chest pain EXAM: CHEST - 2 VIEW COMPARISON:  September 25, 2015 FINDINGS: The heart size and mediastinal contours are within normal limits. Both lungs are clear. The visualized skeletal structures are unremarkable. IMPRESSION: No active cardiopulmonary disease. Electronically Signed   By: Jonna Clark M.D.   On: 09/19/2019 02:22    Procedures Procedures (including critical care time)  Medications Ordered in ED Medications  sodium chloride flush (NS) 0.9 % injection 3 mL (has no administration in time range)  ibuprofen (ADVIL) tablet 400 mg (has no administration in time range)     Initial Impression / Assessment and Plan / ED Course  I have reviewed the triage vital signs and the nursing notes.  Pertinent labs & imaging results that were available during my care of the patient were reviewed by me and considered in my medical  decision making (see chart for details).    Well-appearing female presents hemodynamically unremarkable, now with concern for about 1 week of pain in the both the left lower chest, left shoulder. Consideration of referred pain, but with reproducible tenderness in the trapezius, and soft abdomen, no evidence for splenic or other acute abdominal processes. Patient also has no GI complaints. Given the positional nature, there is some suspicion for the musculoskeletal or gastroesophageal etiology, no evidence for ACS with 2 normal troponin, nonischemic EKG, and with symmetric pulses, no edema, no  loss of sensation, no dyspnea, low suspicion for dissection versus DVT. Notably, the patient has been seen and evaluated for concern of swelling, possible DVT in the past, with no reported DVT. Given reassuring findings here, after discussion on return precautions and follow-up instructions, patient started on anti-inflammatories, PPI, discharged in stable condition.  Final Clinical Impressions(s) / ED Diagnoses   Final diagnoses:  Atypical chest pain    ED Discharge Orders         Ordered    ibuprofen (ADVIL) 400 MG tablet  3 times daily     09/19/19 0857    omeprazole (PRILOSEC) 20 MG capsule  Daily     09/19/19 0857           Gerhard MunchLockwood, Marcee Jacobs, MD 09/19/19 [phone]

## 2019-09-19 NOTE — ED Triage Notes (Signed)
The pt has had chest pain  Epigastric area with lt shoulder and arm pain since Tuesday sob slightly

## 2019-12-23 ENCOUNTER — Encounter: Payer: Self-pay | Admitting: Nurse Practitioner

## 2019-12-23 ENCOUNTER — Ambulatory Visit (INDEPENDENT_AMBULATORY_CARE_PROVIDER_SITE_OTHER): Admitting: Nurse Practitioner

## 2019-12-23 ENCOUNTER — Other Ambulatory Visit: Payer: Self-pay

## 2019-12-23 VITALS — BP 118/84 | HR 75 | Temp 98.7°F | Ht 65.2 in | Wt 217.4 lb

## 2019-12-23 DIAGNOSIS — M25512 Pain in left shoulder: Secondary | ICD-10-CM

## 2019-12-23 DIAGNOSIS — R2231 Localized swelling, mass and lump, right upper limb: Secondary | ICD-10-CM

## 2019-12-23 DIAGNOSIS — Z1159 Encounter for screening for other viral diseases: Secondary | ICD-10-CM

## 2019-12-23 DIAGNOSIS — Z23 Encounter for immunization: Secondary | ICD-10-CM | POA: Diagnosis not present

## 2019-12-23 DIAGNOSIS — Z13228 Encounter for screening for other metabolic disorders: Secondary | ICD-10-CM

## 2019-12-23 DIAGNOSIS — R21 Rash and other nonspecific skin eruption: Secondary | ICD-10-CM | POA: Diagnosis not present

## 2019-12-23 DIAGNOSIS — Z1231 Encounter for screening mammogram for malignant neoplasm of breast: Secondary | ICD-10-CM

## 2019-12-23 MED ORDER — PREDNISONE 10 MG (21) PO TBPK: ORAL_TABLET | ORAL | 0 refills | Status: DC

## 2019-12-23 MED ORDER — NYSTATIN 100000 UNIT/GM EX POWD: 1.0000 "application " | Freq: Three times a day (TID) | CUTANEOUS | 0 refills | Status: DC

## 2019-12-23 NOTE — Progress Notes (Signed)
This visit occurred during the SARS-CoV-2 public health emergency.  Safety protocols were in place, including screening questions prior to the visit, additional usage of staff PPE, and extensive cleaning of exam room while observing appropriate contact time as indicated for disinfecting solutions.  Subjective:     Patient ID: Angel Holt , female    DOB: Sep 17, 1956 , 64 y.o.   MRN: [id number]   Chief Complaint  Patient presents with  . Establish Care  . Shoulder Pain    patient stated her shoulder has been feeling heavy and numbness to it for the past 5 months     HPI  Here to establish care - she had been seen by Dr. Benjamine Mola (unsure of last name) has been about 5-6 years due to not having insurance.  She was referred by Lucas Mallow and Carrolyn Leigh who are both patients here at the office.  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  Married.  3 children - 2 girls and one boy all healthy.  She has not had a colonoscopy (less than 10 years) and mammogram.  PMH - possible thyroid issues - has taken medication before.  Right hand had carpal tunnel with surgery - more than 5 years ago. She was told she had shoulder spurs in the past.   She has a previous history of left shoulder pain while working at a fracture.   [facility] - Mother - pulmonary embolism deceased.  Sister - deceased from breast cancer.    Wt Readings from Last 3 Encounters: 12/23/19 : 217 lb 6.4 oz (98.6 kg) 09/19/19 : 215 lb (97.5 kg) 07/02/18 : 205 lb (93 kg)  She is caring for her brother who has developmental challenges and he lives with her.   Shoulder Pain  The pain is present in the left shoulder. This is a new problem. The current episode started more than 1 month ago (since October 2020). The problem occurs intermittently. The problem has been gradually worsening. The quality of the pain is described as aching (tingling). Associated symptoms  include tingling. Pertinent negatives include no numbness or stiffness. Associated symptoms comments: Sleeps in recliner. She is right handed.  Feels like her left side of her arm is sleep.  . She has tried nothing for the symptoms. The treatment provided mild relief. Family history does not include gout or rheumatoid arthritis. There is no history of diabetes, gout, osteoarthritis or rheumatoid arthritis.     Past Medical History:  Diagnosis Date  . Thyroid disease      History reviewed. No pertinent family history.   Current Outpatient Medications:  Marland Kitchen  Multiple Vitamin (MULTIVITAMIN WITH MINERALS) TABS tablet, Take 1 tablet by mouth daily., Disp: , Rfl:  .  omeprazole (PRILOSEC) 20 MG capsule, Take 1 capsule (20 mg total) by mouth daily. Take one tablet daily (Patient not taking: Reported on 12/23/2019), Disp: 21 capsule, Rfl: 0   No Known Allergies   Review of Systems  Constitutional: Negative.   Respiratory: Negative.   Cardiovascular: Negative.   Musculoskeletal: Negative for gout and stiffness.  Neurological: Positive for tingling. Negative for dizziness, numbness and headaches.     Today's Vitals   12/23/19 1501  BP: 118/84  Pulse: 75  Temp: 98.7 F (37.1 C)  TempSrc: Oral  SpO2: 96%  Weight: 217 lb 6.4 oz (98.6 kg)  Height: 5' 5.2" (1.656 m)  PainSc: 0-No pain  Body mass index is 35.96 kg/m.   Objective:  Physical Exam Constitutional:      Appearance: Normal appearance.  Cardiovascular:     Rate and Rhythm: Normal rate and regular rhythm.     Pulses: Normal pulses.     Heart sounds: Normal heart sounds.  Pulmonary:     Effort: Pulmonary effort is normal. No respiratory distress.     Breath sounds: Normal breath sounds.  Musculoskeletal:        General: Tenderness (left shoulder posterior bursa space) present. No swelling.  Skin:    General: Skin is warm and dry.     Capillary Refill: Capillary refill takes less than 2 seconds.     Findings: Rash (navel  has irritation and cracked area to right side, slightly moist) present.     Comments: Right inner upper arm with firm movable lump present  Neurological:     General: No focal deficit present.     Mental Status: She is alert and oriented to person, place, and time.  Psychiatric:        Mood and Affect: Mood normal.        Behavior: Behavior normal.        Thought Content: Thought content normal.        Judgment: Judgment normal.         Assessment And Plan:     1. Need for influenza vaccination Influenza vaccine given in office Advised to take Tylenol as needed for muscle aches or fever - Flu Vaccine QUAD 6+ mos PF IM (Fluarix Quad PF)  2. Acute pain of left shoulder  Decreased range of motion and mild tenderness to posterior bursa space  Osteoarthritis vs bursitis vs ligament tear  Will treat with prednisone - DG Shoulder Left; Future  3. Screening mammogram, encounter for  Pt instructed on Self Breast Exam.According to ACOG guidelines Women aged 40 and older are recommended to get an annual mammogram. Form completed and given to patient contact the [facility] for appointment scheduing.   Pt encouraged to get annual mammogram  She has not had a mammogram in several years - MM Digital Screening; Future  4. Encounter for hepatitis C screening test for low risk patient  Will check for Hepatitis C screening due to being born between the years [birth year] - Hepatitis C antibody  5. Encounter for screening for metabolic disorder  - CBC no Diff - CMP14+EGFR  6. Lump of skin of right upper extremity Right inner upper arm with firm lump and moveable, will see what her breast mammogram shows if normal will order an ultrasound   7. Rash and nonspecific skin eruption Irritation to navel with cracked skin Nystatin powder to area three times a day - nystatin (NYSTATIN) powder; Apply 1 application topically 3 (three) times daily.  Dispense: 15 g; Refill: 0   Janece  Moore, FNP    THE PATIENT IS ENCOURAGED TO PRACTICE SOCIAL DISTANCING DUE TO THE COVID-19 PANDEMIC.   

## 2019-12-24 ENCOUNTER — Ambulatory Visit
Admission: RE | Admit: 2019-12-24 | Discharge: 2019-12-24 | Disposition: A | Discharge status: Home / Self Care | Payer: No Typology Code available for payment source | Source: Ambulatory Visit | Attending: Nurse Practitioner | Admitting: Nurse Practitioner

## 2019-12-24 DIAGNOSIS — M25512 Pain in left shoulder: Secondary | ICD-10-CM

## 2019-12-24 LAB — CMP14+EGFR
ALT: 11 IU/L (ref 0–32)
AST: 11 IU/L (ref 0–40)
Albumin/Globulin Ratio: 1.6 (ref 1.2–2.2)
Albumin: 4.4 g/dL (ref 3.8–4.8)
Alkaline Phosphatase: 61 IU/L (ref 39–117)
BUN/Creatinine Ratio: 19 (ref 12–28)
BUN: 13 mg/dL (ref 8–27)
Bilirubin Total: 0.8 mg/dL (ref 0.0–1.2)
CO2: 23 mmol/L (ref 20–29)
Calcium: 9.7 mg/dL (ref 8.7–10.3)
Chloride: 105 mmol/L (ref 96–106)
Creatinine, Ser: 0.7 mg/dL (ref 0.57–1.00)
GFR calc Af Amer: 107 mL/min/{1.73_m2} (ref >59)
GFR calc non Af Amer: 93 mL/min/{1.73_m2} (ref >59)
Globulin, Total: 2.7 g/dL (ref 1.5–4.5)
Glucose: 78 mg/dL (ref 65–99)
Potassium: 3.9 mmol/L (ref 3.5–5.2)
Sodium: 143 mmol/L (ref 134–144)
Total Protein: 7.1 g/dL (ref 6.0–8.5)

## 2019-12-24 LAB — CBC
Hematocrit: 40 % (ref 34.0–46.6)
Hemoglobin: 12.9 g/dL (ref 11.1–15.9)
MCH: 29.7 pg (ref 26.6–33.0)
MCHC: 32.3 g/dL (ref 31.5–35.7)
MCV: 92 fL (ref 79–97)
Platelets: 288 10*3/uL (ref 150–450)
RBC: 4.35 x10E6/uL (ref 3.77–5.28)
RDW: 12.2 % (ref 11.7–15.4)
WBC: 8 10*3/uL (ref 3.4–10.8)

## 2019-12-24 LAB — HEPATITIS C ANTIBODY: Hep C Virus Ab: 0.1 s/co ratio (ref 0.0–0.9)

## 2020-03-29 ENCOUNTER — Other Ambulatory Visit: Payer: Self-pay

## 2020-03-29 ENCOUNTER — Other Ambulatory Visit: Payer: Self-pay | Admitting: Nurse Practitioner

## 2020-03-29 ENCOUNTER — Other Ambulatory Visit (HOSPITAL_COMMUNITY)
Admission: RE | Admit: 2020-03-29 | Discharge: 2020-03-29 | Disposition: A | Discharge status: Home / Self Care | Source: Ambulatory Visit | Attending: Nurse Practitioner | Admitting: Nurse Practitioner

## 2020-03-29 ENCOUNTER — Encounter: Payer: Self-pay | Admitting: Nurse Practitioner

## 2020-03-29 ENCOUNTER — Ambulatory Visit (INDEPENDENT_AMBULATORY_CARE_PROVIDER_SITE_OTHER): Admitting: Nurse Practitioner

## 2020-03-29 VITALS — BP 116/80 | HR 81 | Temp 98.4°F | Ht 65.2 in | Wt 217.0 lb

## 2020-03-29 DIAGNOSIS — Z124 Encounter for screening for malignant neoplasm of cervix: Secondary | ICD-10-CM

## 2020-03-29 DIAGNOSIS — Z1211 Encounter for screening for malignant neoplasm of colon: Secondary | ICD-10-CM | POA: Diagnosis not present

## 2020-03-29 DIAGNOSIS — Z113 Encounter for screening for infections with a predominantly sexual mode of transmission: Secondary | ICD-10-CM | POA: Diagnosis present

## 2020-03-29 DIAGNOSIS — Z0000 Encounter for general adult medical examination without abnormal findings: Secondary | ICD-10-CM

## 2020-03-29 DIAGNOSIS — Z1231 Encounter for screening mammogram for malignant neoplasm of breast: Secondary | ICD-10-CM

## 2020-03-29 DIAGNOSIS — Z8639 Personal history of other endocrine, nutritional and metabolic disease: Secondary | ICD-10-CM

## 2020-03-29 NOTE — Patient Instructions (Signed)
Health Maintenance, Female Adopting a healthy lifestyle and getting preventive care are important in promoting health and wellness. Ask your health care provider about:  The right schedule for you to have regular tests and exams.  Things you can do on your own to prevent diseases and keep yourself healthy. What should I know about diet, weight, and exercise? Eat a healthy diet   Eat a diet that includes plenty of vegetables, fruits, low-fat dairy products, and lean protein.  Do not eat a lot of foods that are high in solid fats, added sugars, or sodium. Maintain a healthy weight Body mass index (BMI) is used to identify weight problems. It estimates body fat based on height and weight. Your health care provider can help determine your BMI and help you achieve or maintain a healthy weight. Get regular exercise Get regular exercise. This is one of the most important things you can do for your health. Most adults should:  Exercise for at least 150 minutes each week. The exercise should increase your heart rate and make you sweat (moderate-intensity exercise).  Do strengthening exercises at least twice a week. This is in addition to the moderate-intensity exercise.  Spend less time sitting. Even light physical activity can be beneficial. Watch cholesterol and blood lipids Have your blood tested for lipids and cholesterol at 64 years of age, then have this test every 5 years. Have your cholesterol levels checked more often if:  Your lipid or cholesterol levels are high.  You are older than 64 years of age.  You are at high risk for heart disease. What should I know about cancer screening? Depending on your health history and family history, you may need to have cancer screening at various ages. This may include screening for:  Breast cancer.  Cervical cancer.  Colorectal cancer.  Skin cancer.  Lung cancer. What should I know about heart disease, diabetes, and high blood  pressure? Blood pressure and heart disease  High blood pressure causes heart disease and increases the risk of stroke. This is more likely to develop in people who have high blood pressure readings, are of African descent, or are overweight.  Have your blood pressure checked: ? Every 3-5 years if you are 18-39 years of age. ? Every year if you are 40 years old or older. Diabetes Have regular diabetes screenings. This checks your fasting blood sugar level. Have the screening done:  Once every three years after age 40 if you are at a normal weight and have a low risk for diabetes.  More often and at a younger age if you are overweight or have a high risk for diabetes. What should I know about preventing infection? Hepatitis B If you have a higher risk for hepatitis B, you should be screened for this virus. Talk with your health care provider to find out if you are at risk for hepatitis B infection. Hepatitis C Testing is recommended for:  Everyone born from 1945 through 1965.  Anyone with known risk factors for hepatitis C. Sexually transmitted infections (STIs)  Get screened for STIs, including gonorrhea and chlamydia, if: ? You are sexually active and are younger than 64 years of age. ? You are older than 64 years of age and your health care provider tells you that you are at risk for this type of infection. ? Your sexual activity has changed since you were last screened, and you are at increased risk for chlamydia or gonorrhea. Ask your health care provider if   you are at risk.  Ask your health care provider about whether you are at high risk for HIV. Your health care provider may recommend a prescription medicine to help prevent HIV infection. If you choose to take medicine to prevent HIV, you should first get tested for HIV. You should then be tested every 3 months for as long as you are taking the medicine. Pregnancy  If you are about to stop having your period (premenopausal) and  you may become pregnant, seek counseling before you get pregnant.  Take 400 to 800 micrograms (mcg) of folic acid every day if you become pregnant.  Ask for birth control (contraception) if you want to prevent pregnancy. Osteoporosis and menopause Osteoporosis is a disease in which the bones lose minerals and strength with aging. This can result in bone fractures. If you are 65 years old or older, or if you are at risk for osteoporosis and fractures, ask your health care provider if you should:  Be screened for bone loss.  Take a calcium or vitamin D supplement to lower your risk of fractures.  Be given hormone replacement therapy (HRT) to treat symptoms of menopause. Follow these instructions at home: Lifestyle  Do not use any products that contain nicotine or tobacco, such as cigarettes, e-cigarettes, and chewing tobacco. If you need help quitting, ask your health care provider.  Do not use street drugs.  Do not share needles.  Ask your health care provider for help if you need support or information about quitting drugs. Alcohol use  Do not drink alcohol if: ? Your health care provider tells you not to drink. ? You are pregnant, may be pregnant, or are planning to become pregnant.  If you drink alcohol: ? Limit how much you use to 0-1 drink a day. ? Limit intake if you are breastfeeding.  Be aware of how much alcohol is in your drink. In the [place] one drink equals one 12 oz bottle of beer (355 mL), one 5 oz glass of wine (148 mL), or one 1 oz glass of hard liquor (44 mL). General instructions  Schedule regular health, dental, and eye exams.  Stay current with your vaccines.  Tell your health care provider if: ? You often feel depressed. ? You have ever been abused or do not feel safe at home. Summary  Adopting a healthy lifestyle and getting preventive care are important in promoting health and wellness.  Follow your health care provider's instructions about healthy  diet, exercising, and getting tested or screened for diseases.  Follow your health care provider's instructions on monitoring your cholesterol and blood pressure. This information is not intended to replace advice given to you by your health care provider. Make sure you discuss any questions you have with your health care provider. Document Revised: 11/26/2018 Document Reviewed: 11/26/2018 Elsevier Patient Education  2020 [employer] Inc[employer]  

## 2020-03-29 NOTE — Progress Notes (Signed)
This visit occurred during the SARS-CoV-2 public health emergency.  Safety protocols were in place, including screening questions prior to the visit, additional usage of staff PPE, and extensive cleaning of exam room while observing appropriate contact time as indicated for disinfecting solutions.  Subjective:     Patient ID: Angel Holt , female    DOB: 02-May-1956 , 64 y.o.   MRN: [id number]   Chief Complaint  Patient presents with  . Annual Exam    HPI  Here for HM  She has had her covid vaccine and did well  She has had a history of thyroid problems where was elevated took medications but was stopped after seeing another provider and felt she did not need.   Wt Readings from Last 3 Encounters: 03/29/20 : 217 lb (98.4 kg) 12/23/19 : 217 lb 6.4 oz (98.6 kg) 09/19/19 : 215 lb (97.5 kg)    The patient states she uses status post hysterectomy for birth control. Last LMP was No LMP recorded. Patient has had a hysterectomy.. Negative for Dysmenorrhea and Negative for Menorrhagia Mammogram last done 2015. Negative for: breast discharge, breast lump(s), breast pain and breast self exam.  Pertinent negatives include abnormal bleeding (hematology), anxiety, decreased libido, depression, difficulty falling sleep, dyspareunia, history of infertility, nocturia, sexual dysfunction, sleep disturbances, urinary incontinence, urinary urgency, vaginal discharge and vaginal itching. Diet regular. The patient states her exercise level is none.     The patient's tobacco use is:  Social History   Tobacco Use  Smoking Status Former Smoker  Smokeless Tobacco Never Used   She has been exposed to passive smoke. The patient's alcohol use is:  Social History   Substance and Sexual Activity  Alcohol Use No   Additional information: Last pap more than 3 years ago, she will have one done today  Past Medical History:  Diagnosis Date  . Thyroid disease      No family history on file.   Current  Outpatient Medications:  Marland Kitchen  Multiple Vitamin (MULTIVITAMIN WITH MINERALS) TABS tablet, Take 1 tablet by mouth daily., Disp: , Rfl:  .  nystatin (NYSTATIN) powder, Apply 1 application topically 3 (three) times daily., Disp: 15 g, Rfl: 0   No Known Allergies   Review of Systems  Constitutional: Negative.   HENT: Negative.   Eyes: Negative.   Respiratory: Negative.   Cardiovascular: Negative.  Negative for chest pain, palpitations and leg swelling.  Gastrointestinal: Negative.   Endocrine: Negative.   Genitourinary: Negative.   Musculoskeletal: Negative.   Skin: Negative.   Allergic/Immunologic: Negative.   Neurological: Negative.  Negative for dizziness and headaches.  Hematological: Negative.   Psychiatric/Behavioral: Negative.      Today's Vitals   03/29/20 1507  BP: 116/80  Pulse: 81  Temp: 98.4 F (36.9 C)  TempSrc: Oral  Weight: 217 lb (98.4 kg)  Height: 5' 5.2" (1.656 m)  PainSc: 0-No pain   Body mass index is 35.89 kg/m.   Objective:  Physical Exam Vitals reviewed.  Constitutional:      Appearance: Normal appearance. She is well-developed.  HENT:     Head: Normocephalic and atraumatic.     Right Ear: Hearing, tympanic membrane, ear canal and external ear normal. There is no impacted cerumen.     Left Ear: Hearing, tympanic membrane, ear canal and external ear normal. There is no impacted cerumen.     Nose:     Comments: Deferred - mask    Mouth/Throat:     Comments: Deferred -  mask Eyes:     General: Lids are normal.     Conjunctiva/sclera: Conjunctivae normal.     Pupils: Pupils are equal, round, and reactive to light.     Funduscopic exam:    Right eye: No papilledema.        Left eye: No papilledema.  Neck:     Thyroid: No thyroid mass.     Vascular: No carotid bruit.  Cardiovascular:     Rate and Rhythm: Normal rate and regular rhythm.     Pulses: Normal pulses.     Heart sounds: Normal heart sounds. No murmur.  Pulmonary:     Effort:  Pulmonary effort is normal.     Breath sounds: Normal breath sounds.  Abdominal:     General: Abdomen is flat. Bowel sounds are normal.     Palpations: Abdomen is soft.  Genitourinary:    Vagina: Normal.     Uterus: Absent.      Adnexa: Right adnexa normal and left adnexa normal.       Right: No mass or tenderness.         Left: No mass or tenderness.    Musculoskeletal:        General: No swelling. Normal range of motion.     Cervical back: Full passive range of motion without pain, normal range of motion and neck supple.     Right lower leg: No edema.     Left lower leg: No edema.  Skin:    General: Skin is warm and dry.     Capillary Refill: Capillary refill takes less than 2 seconds.  Neurological:     General: No focal deficit present.     Mental Status: She is alert and oriented to person, place, and time.     Cranial Nerves: No cranial nerve deficit.     Sensory: No sensory deficit.  Psychiatric:        Mood and Affect: Mood normal.        Behavior: Behavior normal.        Thought Content: Thought content normal.        Judgment: Judgment normal.         Assessment And Plan:     1. Health maintenance examination . Behavior modifications discussed and diet history reviewed.   . Pt will continue to exercise regularly and modify diet with low GI, plant based foods and decrease intake of processed foods.  . Recommend intake of daily multivitamin, Vitamin D, and calcium.  . Recommend mammogram (scheduled her for mammogram) and colonoscopy for preventive screenings, as well as recommend immunizations that include influenza, TDAP (she has not had done)  2. Encounter for screening colonoscopy  According to USPTF Colorectal cancer Screening guidelines. Colonoscopy is recommended every 10 years, starting at age 50years.  Will refer to GI for colon cancer screening. - Ambulatory referral to Gastroenterology  3. Screening examination for STD (sexually transmitted  disease)  - HIV antibody (with reflex) - T pallidum Screening [facility] - Cervicovaginal ancillary only  4. History of thyroid disorder  - TSH - T4 - T3, free  5. Encounter for Papanicolaou smear of cervix  She had a hysterectomy, used small speculum - Cytology -Pap Smear   [name], FNP    THE PATIENT IS ENCOURAGED TO PRACTICE SOCIAL DISTANCING DUE TO THE COVID-19 PANDEMIC.

## 2020-03-31 ENCOUNTER — Other Ambulatory Visit: Payer: Self-pay | Admitting: Nurse Practitioner

## 2020-03-31 DIAGNOSIS — A599 Trichomoniasis, unspecified: Secondary | ICD-10-CM

## 2020-03-31 LAB — T PALLIDUM SCREENING CASCADE: T pallidum Antibodies (TP-PA): REACTIVE — AB

## 2020-03-31 LAB — CERVICOVAGINAL ANCILLARY ONLY
Bacterial Vaginitis (gardnerella): NEGATIVE
Candida Glabrata: NEGATIVE
Candida Vaginitis: NEGATIVE
Chlamydia: NEGATIVE
Comment: NEGATIVE
Comment: NEGATIVE
Comment: NEGATIVE
Comment: NEGATIVE
Comment: NEGATIVE
Comment: NORMAL
Neisseria Gonorrhea: NEGATIVE
Trichomonas: POSITIVE — AB

## 2020-03-31 LAB — CYTOLOGY - PAP
Adequacy: ABSENT
Diagnosis: NEGATIVE

## 2020-03-31 LAB — T3, FREE: T3, Free: 3.1 pg/mL (ref 2.0–4.4)

## 2020-03-31 LAB — HIV ANTIBODY (ROUTINE TESTING W REFLEX): HIV Screen 4th Generation wRfx: NONREACTIVE

## 2020-03-31 LAB — T PALLIDUM ANTIBODY, EIA: T pallidum Antibody, EIA: POSITIVE — AB

## 2020-03-31 LAB — T4: T4, Total: 8.1 ug/dL (ref 4.5–12.0)

## 2020-03-31 LAB — RPR TITER (REFLEX): RPR: NONREACTIVE

## 2020-03-31 LAB — TSH: TSH: 1.52 u[IU]/mL (ref 0.450–4.500)

## 2020-03-31 MED ORDER — METRONIDAZOLE 500 MG PO TABS: 500.0000 mg | ORAL_TABLET | Freq: Two times a day (BID) | ORAL | 0 refills | Status: AC

## 2020-04-01 ENCOUNTER — Ambulatory Visit
Admission: RE | Admit: 2020-04-01 | Discharge: 2020-04-01 | Disposition: A | Discharge status: Home / Self Care | Source: Ambulatory Visit | Attending: Nurse Practitioner | Admitting: Nurse Practitioner

## 2020-04-01 ENCOUNTER — Other Ambulatory Visit: Payer: Self-pay

## 2020-04-01 DIAGNOSIS — Z1231 Encounter for screening mammogram for malignant neoplasm of breast: Secondary | ICD-10-CM

## 2020-04-04 ENCOUNTER — Other Ambulatory Visit: Payer: Self-pay | Admitting: Nurse Practitioner

## 2020-04-04 DIAGNOSIS — R928 Other abnormal and inconclusive findings on diagnostic imaging of breast: Secondary | ICD-10-CM

## 2020-04-18 ENCOUNTER — Ambulatory Visit
Admission: RE | Admit: 2020-04-18 | Discharge: 2020-04-18 | Disposition: A | Discharge status: Home / Self Care | Source: Ambulatory Visit | Attending: Nurse Practitioner | Admitting: Nurse Practitioner

## 2020-04-18 ENCOUNTER — Other Ambulatory Visit: Payer: Self-pay

## 2020-04-18 DIAGNOSIS — R928 Other abnormal and inconclusive findings on diagnostic imaging of breast: Secondary | ICD-10-CM

## 2020-06-08 ENCOUNTER — Telehealth: Admitting: Family

## 2020-06-08 ENCOUNTER — Encounter: Payer: Self-pay | Admitting: Nurse Practitioner

## 2020-06-08 DIAGNOSIS — R399 Unspecified symptoms and signs involving the genitourinary system: Secondary | ICD-10-CM

## 2020-06-08 DIAGNOSIS — R109 Unspecified abdominal pain: Secondary | ICD-10-CM

## 2020-06-08 NOTE — Progress Notes (Signed)
Based on what you shared with me, I feel your condition warrants further evaluation and I recommend that you be seen for a face to face office visit.  Given your symptoms of a UTI with back pain you need to be seen face to face to be evaluated.   NOTE: If you entered your credit card information for this eVisit, you will not be charged. You may see a "hold" on your card for the $35 but that hold will drop off and you will not have a charge processed.   If you are having a true medical emergency please call 911.      For an urgent face to face visit, [facility] has five urgent care centers for your convenience:      NEW:  [facility] at [facility] Directions [phone] [address] [place], [place] [address] . 10 am - 6pm Monday - Friday    [facility]) Get Driving Directions [phone] [address] [place], [place] [address] . 10 am to 8 pm Monday-Friday . 12 pm to 8 pm [facility] at [facility] Get Driving Directions [phone] [address], [address] [place], [place] [address] . 8 am to 8 pm Monday-Friday . 9 am to 6 pm Saturday . 11 am to 6 pm Sunday     [facility] at [facility] Get Driving Directions  [phone] [address] [place], [place] [address] . 8 am to 8 pm Monday-Friday . 8 am to 4 pm [facility] at [facility] Directions [phone] [address] [place], [place] [address] . 12 pm to 6 pm Monday-Friday      Your e-visit answers were reviewed by a board certified advanced clinical practitioner to complete your personal care plan.  Thank you for using e-Visits.

## 2020-06-09 ENCOUNTER — Encounter: Payer: Self-pay | Admitting: Nurse Practitioner

## 2020-06-09 ENCOUNTER — Other Ambulatory Visit: Payer: Self-pay

## 2020-06-09 ENCOUNTER — Ambulatory Visit (INDEPENDENT_AMBULATORY_CARE_PROVIDER_SITE_OTHER): Admitting: Nurse Practitioner

## 2020-06-09 VITALS — BP 122/80 | HR 77 | Temp 100.3°F | Ht 65.2 in | Wt 214.0 lb

## 2020-06-09 DIAGNOSIS — R109 Unspecified abdominal pain: Secondary | ICD-10-CM | POA: Diagnosis not present

## 2020-06-09 DIAGNOSIS — F4321 Adjustment disorder with depressed mood: Secondary | ICD-10-CM

## 2020-06-09 DIAGNOSIS — R319 Hematuria, unspecified: Secondary | ICD-10-CM

## 2020-06-09 DIAGNOSIS — N390 Urinary tract infection, site not specified: Secondary | ICD-10-CM

## 2020-06-09 LAB — POCT URINALYSIS DIPSTICK
Bilirubin, UA: NEGATIVE
Glucose, UA: NEGATIVE
Ketones, UA: NEGATIVE
Nitrite, UA: POSITIVE
Protein, UA: POSITIVE — AB
Spec Grav, UA: 1.02 (ref 1.010–1.025)
Urobilinogen, UA: 1 E.U./dL
pH, UA: 6 (ref 5.0–8.0)

## 2020-06-09 MED ORDER — CEFTRIAXONE SODIUM 1 G IJ SOLR
1.0000 g | Freq: Once | INTRAMUSCULAR | Status: AC
  Administered 2020-06-09: 1 g via INTRAMUSCULAR

## 2020-06-09 MED ORDER — NITROFURANTOIN MONOHYD MACRO 100 MG PO CAPS: 100.0000 mg | ORAL_CAPSULE | Freq: Two times a day (BID) | ORAL | 0 refills | Status: AC

## 2020-06-09 NOTE — Progress Notes (Addendum)
This visit occurred during the SARS-CoV-2 public health emergency.  Safety protocols were in place, including screening questions prior to the visit, additional usage of staff PPE, and extensive cleaning of exam room while observing appropriate contact time as indicated for disinfecting solutions.  Subjective:     Patient ID: Angel Holt , female    DOB: Jul 20, 1956 , 64 y.o.   MRN: [id number]   Chief Complaint  Patient presents with  . Depression    husband passed away last month   . Back Pain    patient stated she has been having lower back pain and flank pain    HPI  Urinary Frequency  This is a new problem. The current episode started in the past 7 days. The problem has been gradually worsening. The quality of the pain is described as aching. There has been no fever. She is not sexually active. There is no history of pyelonephritis. Associated symptoms include flank pain, frequency and urgency. Pertinent negatives include no hematuria, hesitancy or vomiting. She has tried nothing for the symptoms.     Past Medical History:  Diagnosis Date  . Thyroid disease      No family history on file.   Current Outpatient Medications:  Marland Kitchen  Multiple Vitamin (MULTIVITAMIN WITH MINERALS) TABS tablet, Take 1 tablet by mouth daily., Disp: , Rfl:  .  nystatin (NYSTATIN) powder, Apply 1 application topically 3 (three) times daily., Disp: 15 g, Rfl: 0   No Known Allergies   Review of Systems  Constitutional: Negative.   Respiratory: Negative.   Cardiovascular: Negative.  Negative for chest pain and palpitations.  Gastrointestinal: Negative for vomiting.  Genitourinary: Positive for flank pain, frequency and urgency. Negative for hematuria and hesitancy.  Psychiatric/Behavioral: Negative.      Today's Vitals   06/09/20 1419  BP: 122/80  Pulse: 77  Weight: 214 lb (97.1 kg)  PainSc: 2    Body mass index is 35.39 kg/m.   Objective:  Physical Exam Vitals reviewed.  Constitutional:       Appearance: Normal appearance.  Cardiovascular:     Rate and Rhythm: Normal rate and regular rhythm.     Pulses: Normal pulses.     Heart sounds: Normal heart sounds. No murmur heard.   Pulmonary:     Effort: Pulmonary effort is normal. No respiratory distress.     Breath sounds: Normal breath sounds.  Abdominal:     Tenderness: There is right CVA tenderness. There is no left CVA tenderness.  Skin:    General: Skin is warm and dry.     Capillary Refill: Capillary refill takes less than 2 seconds.  Neurological:     General: No focal deficit present.     Mental Status: She is alert and oriented to person, place, and time.     Cranial Nerves: No cranial nerve deficit.  Psychiatric:        Mood and Affect: Mood normal.        Behavior: Behavior normal.        Thought Content: Thought content normal.        Judgment: Judgment normal.         Assessment And Plan:       1. Urinary tract infection with hematuria, site unspecified  Urine positive for nitrates  Will treat with rocephin and macrobid  Encouraged to stay well hydrated.  - Urine Culture - cefTRIAXone (ROCEPHIN) injection 1 g - nitrofurantoin, macrocrystal-monohydrate, (MACROBID) 100 MG capsule; Take 1 capsule (100  mg total) by mouth 2 (two) times daily for 5 days.  Dispense: 10 capsule; Refill: 0  2. Flank pain  May be related to urinary tract vs kidney infection. - POCT Urinalysis Dipstick (81002)  3. Grief  She has recently lost her husband unexpectedly  Offered a referral for grief counseling declines at this time.     [name], FNP    THE PATIENT IS ENCOURAGED TO PRACTICE SOCIAL DISTANCING DUE TO THE COVID-19 PANDEMIC.

## 2020-06-13 ENCOUNTER — Encounter: Payer: Self-pay | Admitting: Nurse Practitioner

## 2020-06-13 LAB — URINE CULTURE

## 2020-06-16 ENCOUNTER — Encounter: Payer: Self-pay | Admitting: Nurse Practitioner

## 2021-03-30 ENCOUNTER — Encounter: Payer: Self-pay | Admitting: Nurse Practitioner

## 2021-03-30 ENCOUNTER — Other Ambulatory Visit: Payer: Self-pay

## 2021-03-30 ENCOUNTER — Ambulatory Visit (INDEPENDENT_AMBULATORY_CARE_PROVIDER_SITE_OTHER): Admitting: Nurse Practitioner

## 2021-03-30 VITALS — BP 122/76 | HR 72 | Temp 98.2°F | Ht 65.8 in | Wt 203.2 lb

## 2021-03-30 DIAGNOSIS — Z1211 Encounter for screening for malignant neoplasm of colon: Secondary | ICD-10-CM

## 2021-03-30 DIAGNOSIS — Z0000 Encounter for general adult medical examination without abnormal findings: Secondary | ICD-10-CM

## 2021-03-30 DIAGNOSIS — Z1231 Encounter for screening mammogram for malignant neoplasm of breast: Secondary | ICD-10-CM

## 2021-03-30 DIAGNOSIS — Z8639 Personal history of other endocrine, nutritional and metabolic disease: Secondary | ICD-10-CM

## 2021-03-30 DIAGNOSIS — Z23 Encounter for immunization: Secondary | ICD-10-CM | POA: Diagnosis not present

## 2021-03-30 DIAGNOSIS — Z6833 Body mass index (BMI) 33.0-33.9, adult: Secondary | ICD-10-CM

## 2021-03-30 MED ORDER — TETANUS-DIPHTH-ACELL PERTUSSIS 5-2.5-18.5 LF-MCG/0.5 IM SUSY
0.5000 mL | PREFILLED_SYRINGE | Freq: Once | INTRAMUSCULAR | Status: AC
  Administered 2021-03-30: 0.5 mL via INTRAMUSCULAR

## 2021-03-30 NOTE — Patient Instructions (Addendum)
Health Maintenance, Female Adopting a healthy lifestyle and getting preventive care are important in promoting health and wellness. Ask your health care provider about:  The right schedule for you to have regular tests and exams.  Things you can do on your own to prevent diseases and keep yourself healthy. What should I know about diet, weight, and exercise? Eat a healthy diet  Eat a diet that includes plenty of vegetables, fruits, low-fat dairy products, and lean protein.  Do not eat a lot of foods that are high in solid fats, added sugars, or sodium.   Maintain a healthy weight Body mass index (BMI) is used to identify weight problems. It estimates body fat based on height and weight. Your health care provider can help determine your BMI and help you achieve or maintain a healthy weight. Get regular exercise Get regular exercise. This is one of the most important things you can do for your health. Most adults should:  Exercise for at least 150 minutes each week. The exercise should increase your heart rate and make you sweat (moderate-intensity exercise).  Do strengthening exercises at least twice a week. This is in addition to the moderate-intensity exercise.  Spend less time sitting. Even light physical activity can be beneficial. Watch cholesterol and blood lipids Have your blood tested for lipids and cholesterol at 65 years of age, then have this test every 5 years. Have your cholesterol levels checked more often if:  Your lipid or cholesterol levels are high.  You are older than 65 years of age.  You are at high risk for heart disease. What should I know about cancer screening? Depending on your health history and family history, you may need to have cancer screening at various ages. This may include screening for:  Breast cancer.  Cervical cancer.  Colorectal cancer.  Skin cancer.  Lung cancer. What should I know about heart disease, diabetes, and high blood  pressure? Blood pressure and heart disease  High blood pressure causes heart disease and increases the risk of stroke. This is more likely to develop in people who have high blood pressure readings, are of African descent, or are overweight.  Have your blood pressure checked: ? Every 3-5 years if you are 18-39 years of age. ? Every year if you are 40 years old or older. Diabetes Have regular diabetes screenings. This checks your fasting blood sugar level. Have the screening done:  Once every three years after age 40 if you are at a normal weight and have a low risk for diabetes.  More often and at a younger age if you are overweight or have a high risk for diabetes. What should I know about preventing infection? Hepatitis B If you have a higher risk for hepatitis B, you should be screened for this virus. Talk with your health care provider to find out if you are at risk for hepatitis B infection. Hepatitis C Testing is recommended for:  Everyone born from 1945 through 1965.  Anyone with known risk factors for hepatitis C. Sexually transmitted infections (STIs)  Get screened for STIs, including gonorrhea and chlamydia, if: ? You are sexually active and are younger than 65 years of age. ? You are older than 65 years of age and your health care provider tells you that you are at risk for this type of infection. ? Your sexual activity has changed since you were last screened, and you are at increased risk for chlamydia or gonorrhea. Ask your health care provider   if you are at risk.  Ask your health care provider about whether you are at high risk for HIV. Your health care provider may recommend a prescription medicine to help prevent HIV infection. If you choose to take medicine to prevent HIV, you should first get tested for HIV. You should then be tested every 3 months for as long as you are taking the medicine. Pregnancy  If you are about to stop having your period (premenopausal) and  you may become pregnant, seek counseling before you get pregnant.  Take 400 to 800 micrograms (mcg) of folic acid every day if you become pregnant.  Ask for birth control (contraception) if you want to prevent pregnancy. Osteoporosis and menopause Osteoporosis is a disease in which the bones lose minerals and strength with aging. This can result in bone fractures. If you are 18 years old or older, or if you are at risk for osteoporosis and fractures, ask your health care provider if you should:  Be screened for bone loss.  Take a calcium or vitamin D supplement to lower your risk of fractures.  Be given hormone replacement therapy (HRT) to treat symptoms of menopause. Follow these instructions at home: Lifestyle  Do not use any products that contain nicotine or tobacco, such as cigarettes, e-cigarettes, and chewing tobacco. If you need help quitting, ask your health care provider.  Do not use street drugs.  Do not share needles.  Ask your health care provider for help if you need support or information about quitting drugs. Alcohol use  Do not drink alcohol if: ? Your health care provider tells you not to drink. ? You are pregnant, may be pregnant, or are planning to become pregnant.  If you drink alcohol: ? Limit how much you use to 0-1 drink a day. ? Limit intake if you are breastfeeding.  Be aware of how much alcohol is in your drink. In the [place] one drink equals one 12 oz bottle of beer (355 mL), one 5 oz glass of wine (148 mL), or one 1 oz glass of hard liquor (44 mL). General instructions  Schedule regular health, dental, and eye exams.  Stay current with your vaccines.  Tell your health care provider if: ? You often feel depressed. ? You have ever been abused or do not feel safe at home. Summary  Adopting a healthy lifestyle and getting preventive care are important in promoting health and wellness.  Follow your health care provider's instructions about healthy  diet, exercising, and getting tested or screened for diseases.  Follow your health care provider's instructions on monitoring your cholesterol and blood pressure. This information is not intended to replace advice given to you by your health care provider. Make sure you discuss any questions you have with your health care provider. Document Revised: 11/26/2018 Document Reviewed: 11/26/2018 Elsevier Patient Education  2021 [employer] Inc.  Immunization History  Administered Date(s) Administered  . Influenza,inj,Quad PF,6+ Mos 12/23/2019  . Moderna Sars-Covid-2 Vaccination 11/18/2020  . PFIZER(Purple Top)SARS-COV-2 Vaccination 02/29/2020, 03/22/2020   You received your Tetanus vaccine today.

## 2021-03-30 NOTE — Progress Notes (Signed)
Rutherford Nail as a scribe for Minette Brine, FNP.,have documented all relevant documentation on the behalf of Minette Brine, FNP,as directed by  Minette Brine, FNP while in the presence of Minette Brine, [place]. This visit occurred during the SARS-CoV-2 public health emergency.  Safety protocols were in place, including screening questions prior to the visit, additional usage of staff PPE, and extensive cleaning of exam room while observing appropriate contact time as indicated for disinfecting solutions.  Subjective:     Patient ID: Angel Holt , female    DOB: Oct 01, 1956 , 65 y.o.   MRN: [id number]   Chief Complaint  Patient presents with  . Annual Exam    HPI  Pt is here today for HM exam.  She has changed her diet, exercising and not eating after 8 pm.  She has been working with a [employer] since she has retired.  Overall she is doing well.   Wt Readings from Last 3 Encounters: 03/30/21 : 203 lb 3.2 oz (92.2 kg) 06/09/20 : 214 lb (97.1 kg) 03/29/20 : 217 lb (98.4 kg)    Past Medical History:  Diagnosis Date  . Thyroid disease      History reviewed. No pertinent family history.    Current Outpatient Medications:  Marland Kitchen  Multiple Vitamin (MULTIVITAMIN WITH MINERALS) TABS tablet, Take 1 tablet by mouth daily., Disp: , Rfl:    No Known Allergies   Review of Systems  Constitutional: Negative.  Negative for fatigue.  HENT: Negative.   Eyes: Negative.   Respiratory: Negative.   Cardiovascular: Negative.   Gastrointestinal: Negative.   Endocrine: Negative.  Negative for polydipsia, polyphagia and polyuria.  Genitourinary: Negative.   Musculoskeletal: Negative.   Skin: Negative.   Allergic/Immunologic: Negative.   Neurological: Negative.  Negative for dizziness and headaches.  Hematological: Negative.   Psychiatric/Behavioral: Negative.      Today's Vitals   03/30/21 1510  BP: 122/76  Pulse: 72  Temp: 98.2 F (36.8 C)  TempSrc: Oral  Weight: 203 lb  3.2 oz (92.2 kg)  Height: 5' 5.8" (1.671 m)  PainSc: 0-No pain   Body mass index is 33 kg/m.   Objective:  Physical Exam Vitals reviewed.  Constitutional:      General: She is not in acute distress.    Appearance: Normal appearance. She is well-developed. She is obese.  HENT:     Head: Normocephalic and atraumatic.     Right Ear: Hearing, tympanic membrane, ear canal and external ear normal. There is no impacted cerumen.     Left Ear: Hearing, tympanic membrane, ear canal and external ear normal. There is no impacted cerumen.     Nose:     Comments: Deferred - mask    Mouth/Throat:     Comments: Deferred - mask Eyes:     General: Lids are normal.     Extraocular Movements: Extraocular movements intact.     Conjunctiva/sclera: Conjunctivae normal.     Pupils: Pupils are equal, round, and reactive to light.     Funduscopic exam:    Right eye: No papilledema.        Left eye: No papilledema.  Neck:     Thyroid: No thyroid mass.     Vascular: No carotid bruit.  Cardiovascular:     Rate and Rhythm: Normal rate and regular rhythm.     Pulses: Normal pulses.     Heart sounds: Normal heart sounds. No murmur heard.   Pulmonary:     Effort:  Pulmonary effort is normal. No respiratory distress.     Breath sounds: Normal breath sounds. No wheezing.  Abdominal:     General: Abdomen is flat. Bowel sounds are normal. There is no distension.     Palpations: Abdomen is soft.     Tenderness: There is no abdominal tenderness.  Genitourinary:    Vagina: Normal.     Uterus: Absent.      Adnexa: Right adnexa normal and left adnexa normal.       Right: No mass or tenderness.         Left: No mass or tenderness.    Musculoskeletal:        General: No swelling. Normal range of motion.     Cervical back: Full passive range of motion without pain, normal range of motion and neck supple.     Right lower leg: No edema.     Left lower leg: No edema.  Skin:    General: Skin is warm and dry.      Capillary Refill: Capillary refill takes less than 2 seconds.  Neurological:     General: No focal deficit present.     Mental Status: She is alert and oriented to person, place, and time.     Cranial Nerves: No cranial nerve deficit.     Sensory: No sensory deficit.     Motor: No weakness.  Psychiatric:        Mood and Affect: Mood normal.        Behavior: Behavior normal.        Thought Content: Thought content normal.        Judgment: Judgment normal.         Assessment And Plan:     1. Health maintenance examination . Behavior modifications discussed and diet history reviewed.   . Pt will continue to exercise regularly and modify diet with low GI, plant based foods and decrease intake of processed foods.  . Recommend intake of daily multivitamin, Vitamin D, and calcium.  . Recommend mammogram and colonoscopy for preventive screenings, as well as recommend immunizations that include influenza, TDAP, and Shingles - CMP14+EGFR - CBC  2. BMI 33.0-33.9,adult Congratulated on 11 lb weight loss  Chronic  Discussed healthy diet and regular exercise options   Encouraged to exercise at least 150 minutes per week with 2 days of strength training  3. Encounter for immunization  Will give tetanus vaccine today while in office. Refer to order management. TDAP will be administered to adults 14-23 years old every 10 years. - Tdap (BOOSTRIX) injection 0.5 mL  4. Encounter for screening colonoscopy  According to USPTF Colorectal cancer Screening guidelines. Colonoscopy is recommended every 10 years, starting at age 75  years.  Will refer to GI for colon cancer screening. - Ambulatory referral to Gastroenterology  5. Encounter for screening mammogram for malignant neoplasm of breast  Pt instructed on Self Breast Exam.According to ACOG guidelines Women aged 23 and older are recommended to get an annual mammogram. Form completed and given to patient contact the [facility]  for appointment scheduing.   Pt encouraged to get annual mammogram - MM Digital Screening; Future  6. History of thyroid disorder - TSH - T4 - T3, free    Patient was given opportunity to ask questions. Patient verbalized understanding of the plan and was able to repeat key elements of the plan. All questions were answered to their satisfaction.  Minette Brine, FNP    I, Minette Brine, FNP,  have reviewed all documentation for this visit. The documentation on 03/30/21 for the exam, diagnosis, procedures, and orders are all accurate and complete.  IF YOU HAVE BEEN REFERRED TO A SPECIALIST, IT MAY TAKE 1-2 WEEKS TO SCHEDULE/PROCESS THE REFERRAL. IF YOU HAVE NOT HEARD FROM US/SPECIALIST IN TWO WEEKS, PLEASE GIVE [place] A CALL AT [phone] X [phone].   THE PATIENT IS ENCOURAGED TO PRACTICE SOCIAL DISTANCING DUE TO THE COVID-19 PANDEMIC.     The patient states she uses status post hysterectomy for birth control. Last LMP was No LMP recorded. Patient has had a hysterectomy.. Negative for Dysmenorrhea and Negative for Menorrhagia Mammogram last done 04/18/2020 Negative for: breast discharge, breast lump(s), breast pain and breast self exam.  Pertinent negatives include abnormal bleeding (hematology), anxiety, decreased libido, depression, difficulty falling sleep, dyspareunia, history of infertility, nocturia, sexual dysfunction, sleep disturbances, urinary incontinence, urinary urgency, vaginal discharge and vaginal itching. Diet regular.The patient states her exercise level is moderate  The patient's tobacco use is:  Social History   Tobacco Use  Smoking Status Former Smoker  Smokeless Tobacco Never Used   She has been exposed to passive smoke. The patient's alcohol use is:  Social History   Substance and Sexual Activity  Alcohol Use No   Additional information: Last pap 03/29/2020, next one scheduled for 03/30/2023

## 2021-03-31 LAB — T4: T4, Total: 8.5 ug/dL (ref 4.5–12.0)

## 2021-03-31 LAB — CMP14+EGFR
ALT: 19 IU/L (ref 0–32)
AST: 16 IU/L (ref 0–40)
Albumin/Globulin Ratio: 1.6 (ref 1.2–2.2)
Albumin: 4.4 g/dL (ref 3.8–4.8)
Alkaline Phosphatase: 53 IU/L (ref 44–121)
BUN/Creatinine Ratio: 13 (ref 12–28)
BUN: 9 mg/dL (ref 8–27)
Bilirubin Total: 1 mg/dL (ref 0.0–1.2)
CO2: 24 mmol/L (ref 20–29)
Calcium: 10 mg/dL (ref 8.7–10.3)
Chloride: 103 mmol/L (ref 96–106)
Creatinine, Ser: 0.7 mg/dL (ref 0.57–1.00)
Globulin, Total: 2.8 g/dL (ref 1.5–4.5)
Glucose: 84 mg/dL (ref 65–99)
Potassium: 3.9 mmol/L (ref 3.5–5.2)
Sodium: 144 mmol/L (ref 134–144)
Total Protein: 7.2 g/dL (ref 6.0–8.5)
eGFR: 97 mL/min/{1.73_m2} (ref >59)

## 2021-03-31 LAB — TSH: TSH: 1.53 u[IU]/mL (ref 0.450–4.500)

## 2021-03-31 LAB — CBC
Hematocrit: 36.8 % (ref 34.0–46.6)
Hemoglobin: 12.3 g/dL (ref 11.1–15.9)
MCH: 30.4 pg (ref 26.6–33.0)
MCHC: 33.4 g/dL (ref 31.5–35.7)
MCV: 91 fL (ref 79–97)
Platelets: 252 10*3/uL (ref 150–450)
RBC: 4.04 x10E6/uL (ref 3.77–5.28)
RDW: 13.2 % (ref 11.7–15.4)
WBC: 6 10*3/uL (ref 3.4–10.8)

## 2021-03-31 LAB — T3, FREE: T3, Free: 2.9 pg/mL (ref 2.0–4.4)

## 2022-04-02 ENCOUNTER — Encounter: Payer: Self-pay | Admitting: Nurse Practitioner

## 2022-04-02 ENCOUNTER — Ambulatory Visit (INDEPENDENT_AMBULATORY_CARE_PROVIDER_SITE_OTHER): Payer: Medicare PPO | Admitting: Nurse Practitioner

## 2022-04-02 VITALS — BP 126/84 | HR 74 | Temp 98.0°F | Ht 65.6 in | Wt 202.6 lb

## 2022-04-02 DIAGNOSIS — Z23 Encounter for immunization: Secondary | ICD-10-CM | POA: Diagnosis not present

## 2022-04-02 DIAGNOSIS — Z1231 Encounter for screening mammogram for malignant neoplasm of breast: Secondary | ICD-10-CM | POA: Diagnosis not present

## 2022-04-02 DIAGNOSIS — Z1211 Encounter for screening for malignant neoplasm of colon: Secondary | ICD-10-CM | POA: Diagnosis not present

## 2022-04-02 DIAGNOSIS — E2839 Other primary ovarian failure: Secondary | ICD-10-CM

## 2022-04-02 DIAGNOSIS — R200 Anesthesia of skin: Secondary | ICD-10-CM

## 2022-04-02 DIAGNOSIS — Z0000 Encounter for general adult medical examination without abnormal findings: Secondary | ICD-10-CM | POA: Diagnosis not present

## 2022-04-02 NOTE — Progress Notes (Signed)
?Industrial/product designer as a Education administrator for [employer], FNP.,have documented all relevant documentation on the behalf of Minette Brine, FNP,as directed by  Minette Brine, FNP while in the presence of Minette Brine, [place]. ? ?This visit occurred during the SARS-CoV-2 public health emergency.  Safety protocols were in place, including screening questions prior to the visit, additional usage of staff PPE, and extensive cleaning of exam room while observing appropriate contact time as indicated for disinfecting solutions. ? ?Subjective:  ?  ? Patient ID: Angel Holt , female    DOB: 1956/06/28 , 66 y.o.   MRN: [id number] ? ? ?Chief Complaint  ?Patient presents with  ? Annual Exam  ? ? ?HPI ? ?Pt is here today for HM exam. Her last pap was 2021. ?  ? ?Past Medical History:  ?Diagnosis Date  ? Thyroid disease   ?  ? ?History reviewed. No pertinent family history. ? ? ?Current Outpatient Medications:  ?  Multiple Vitamin (MULTIVITAMIN WITH MINERALS) TABS tablet, Take 1 tablet by mouth daily., Disp: , Rfl:   ? ?No Known Allergies  ? ? ?The patient states she is  status post hysterectomy.   No LMP recorded. Patient has had a hysterectomy.  Negative for: breast discharge, breast lump(s), breast pain and breast self exam. Associated symptoms include abnormal vaginal bleeding. Pertinent negatives include abnormal bleeding (hematology), anxiety, decreased libido, depression, difficulty falling sleep, dyspareunia, history of infertility, nocturia, sexual dysfunction, sleep disturbances, urinary incontinence, urinary urgency, vaginal discharge and vaginal itching. Diet regular.The patient states her exercise level is minimal - she has recently signed up for the gym plans to go walking.  ? ?The patient's tobacco use is:  ?Social History  ? ?Tobacco Use  ?Smoking Status Former  ?Smokeless Tobacco Never  ?Marland Kitchen She has been exposed to passive smoke. The patient's alcohol use is:  ?Social History  ? ?Substance and Sexual Activity  ?Alcohol  Use No  ? ? ? ?Review of Systems  ?Constitutional: Negative.   ?HENT: Negative.    ?Eyes: Negative.   ?Respiratory: Negative.    ?Cardiovascular: Negative.   ?Gastrointestinal: Negative.   ?Endocrine: Negative.   ?Genitourinary: Negative.   ?Musculoskeletal: Negative.   ?Skin: Negative.   ?Allergic/Immunologic: Negative.   ?Neurological: Negative.   ?Hematological: Negative.   ?Psychiatric/Behavioral: Negative.     ? ?Today's Vitals  ? 04/02/22 1406  ?BP: 126/84  ?Pulse: 74  ?Temp: 98 ?F (36.7 ?C)  ?Weight: 202 lb 9.6 oz (91.9 kg)  ?Height: 5' 5.6" (1.666 m)  ? ?Body mass index is 33.1 kg/m?.  ?Wt Readings from Last 3 Encounters:  ?04/02/22 202 lb 9.6 oz (91.9 kg)  ?03/30/21 203 lb 3.2 oz (92.2 kg)  ?06/09/20 214 lb (97.1 kg)  ?  ?BP Readings from Last 3 Encounters:  ?04/02/22 126/84  ?03/30/21 122/76  ?06/09/20 122/80  ?  ?Objective:  ?Physical Exam ?Vitals reviewed.  ?Constitutional:   ?   General: She is not in acute distress. ?   Appearance: Normal appearance. She is well-developed. She is obese.  ?HENT:  ?   Head: Normocephalic and atraumatic.  ?   Right Ear: Hearing, tympanic membrane, ear canal and external ear normal. There is no impacted cerumen.  ?   Left Ear: Hearing, tympanic membrane, ear canal and external ear normal. There is no impacted cerumen.  ?   Nose:  ?   Comments: Deferred - masked ?   Mouth/Throat:  ?   Comments: Deferred - masked ?Eyes:  ?  General: Lids are normal.  ?   Extraocular Movements: Extraocular movements intact.  ?   Conjunctiva/sclera: Conjunctivae normal.  ?   Pupils: Pupils are equal, round, and reactive to light.  ?   Funduscopic exam: ?   Right eye: No papilledema.     ?   Left eye: No papilledema.  ?Neck:  ?   Thyroid: No thyroid mass.  ?   Vascular: No carotid bruit.  ?Cardiovascular:  ?   Rate and Rhythm: Normal rate and regular rhythm.  ?   Pulses: Normal pulses.  ?   Heart sounds: Normal heart sounds. No murmur heard. ?Pulmonary:  ?   Effort: Pulmonary effort is normal.   ?   Breath sounds: Normal breath sounds.  ?Chest:  ?   Chest wall: No mass.  ?Breasts: ?   Tanner Score is 5.  ?   Right: Normal. No mass or tenderness.  ?   Left: Normal. No mass or tenderness.  ?Abdominal:  ?   General: Abdomen is flat. Bowel sounds are normal. There is no distension.  ?   Palpations: Abdomen is soft.  ?   Tenderness: There is no abdominal tenderness.  ?Genitourinary: ?   Rectum: Guaiac result negative.  ?Musculoskeletal:     ?   General: No swelling. Normal range of motion.  ?   Cervical back: Full passive range of motion without pain, normal range of motion and neck supple.  ?   Right lower leg: No edema.  ?   Left lower leg: No edema.  ?Lymphadenopathy:  ?   Upper Body:  ?   Right upper body: No supraclavicular, axillary or pectoral adenopathy.  ?   Left upper body: No supraclavicular, axillary or pectoral adenopathy.  ?Skin: ?   General: Skin is warm and dry.  ?   Capillary Refill: Capillary refill takes less than 2 seconds.  ?Neurological:  ?   General: No focal deficit present.  ?   Mental Status: She is alert and oriented to person, place, and time.  ?   Cranial Nerves: No cranial nerve deficit.  ?   Sensory: No sensory deficit.  ?Psychiatric:     ?   Mood and Affect: Mood normal.     ?   Behavior: Behavior normal.     ?   Thought Content: Thought content normal.     ?   Judgment: Judgment normal.  ?  ? ?   ?Assessment And Plan:  ?   ?1. Health maintenance examination ?Behavior modifications discussed and diet history reviewed.   ?Pt will continue to exercise regularly and modify diet with low GI, plant based foods and decrease intake of processed foods.  ?Recommend intake of daily multivitamin, Vitamin D, and calcium.  ?Recommend mammogram and colonoscopy for preventive screenings, as well as recommend immunizations that include TDAP, and Shingles (will do on another visit had prevnar 20 today) ?- CBC ?- CMP14+EGFR ?- Lipid panel ? ?2. Encounter for screening colonoscopy ?According to  USPTF Colorectal cancer Screening guidelines. Colonoscopy is recommended every 10 years, starting at age 10 years. ?Will refer to GI for colon cancer screening. ?- Ambulatory referral to Gastroenterology ? ?3. Encounter for screening mammogram for breast cancer ?Pt instructed on Self Breast Exam.According to ACOG guidelines Women aged 65 and older are recommended to get an annual mammogram. Form completed and given to patient contact the [facility] for appointment scheduing.  ?Pt encouraged to get annual mammogram ?- MM Digital Screening;  Future ? ?4. Decreased estrogen level ?- DG Bone Density; Future ? ?5. Encounter for immunization ?- Pneumococcal conjugate vaccine 20-valent (Prevnar 20) ? ?6. Numbness of left foot ?Comments: left lateral plantar surface of foot feels "different". Had low back pain initially with pain radiating down left leg which has improved.  ?- Hemoglobin A1c ?- TSH ?- Vitamin B12 ? ? ? ? ? ?Patient was given opportunity to ask questions. Patient verbalized understanding of the plan and was able to repeat key elements of the plan. All q3uestions were answered to their satisfaction.  ? ?Minette Brine, FNP  ? ?I, Minette Brine, FNP, have reviewed all documentation for this visit. The documentation on 04/02/22 for the exam, diagnosis, procedures, and orders are all accurate and complete.  ? ?THE PATIENT IS ENCOURAGED TO PRACTICE SOCIAL DISTANCING DUE TO THE COVID-19 PANDEMIC.   ?

## 2022-04-02 NOTE — Patient Instructions (Addendum)
Health Maintenance, Female ?Adopting a healthy lifestyle and getting preventive care are important in promoting health and wellness. Ask your health care provider about: ?The right schedule for you to have regular tests and exams. ?Things you can do on your own to prevent diseases and keep yourself healthy. ?What should I know about diet, weight, and exercise? ?Eat a healthy diet ? ?Eat a diet that includes plenty of vegetables, fruits, low-fat dairy products, and lean protein. ?Do not eat a lot of foods that are high in solid fats, added sugars, or sodium. ?Maintain a healthy weight ?Body mass index (BMI) is used to identify weight problems. It estimates body fat based on height and weight. Your health care provider can help determine your BMI and help you achieve or maintain a healthy weight. ?Get regular exercise ?Get regular exercise. This is one of the most important things you can do for your health. Most adults should: ?Exercise for at least 150 minutes each week. The exercise should increase your heart rate and make you sweat (moderate-intensity exercise). ?Do strengthening exercises at least twice a week. This is in addition to the moderate-intensity exercise. ?Spend less time sitting. Even light physical activity can be beneficial. ?Watch cholesterol and blood lipids ?Have your blood tested for lipids and cholesterol at 66 years of age, then have this test every 5 years. ?Have your cholesterol levels checked more often if: ?Your lipid or cholesterol levels are high. ?You are older than 66 years of age. ?You are at high risk for heart disease. ?What should I know about cancer screening? ?Depending on your health history and family history, you may need to have cancer screening at various ages. This may include screening for: ?Breast cancer. ?Cervical cancer. ?Colorectal cancer. ?Skin cancer. ?Lung cancer. ?What should I know about heart disease, diabetes, and high blood pressure? ?Blood pressure and heart  disease ?High blood pressure causes heart disease and increases the risk of stroke. This is more likely to develop in people who have high blood pressure readings or are overweight. ?Have your blood pressure checked: ?Every 3-5 years if you are 19-36 years of age. ?Every year if you are 48 years old or older. ?Diabetes ?Have regular diabetes screenings. This checks your fasting blood sugar level. Have the screening done: ?Once every three years after age 18 if you are at a normal weight and have a low risk for diabetes. ?More often and at a younger age if you are overweight or have a high risk for diabetes. ?What should I know about preventing infection? ?Hepatitis B ?If you have a higher risk for hepatitis B, you should be screened for this virus. Talk with your health care provider to find out if you are at risk for hepatitis B infection. ?Hepatitis C ?Testing is recommended for: ?Everyone born from [birth year] through 1965. ?Anyone with known risk factors for hepatitis C. ?Sexually transmitted infections (STIs) ?Get screened for STIs, including gonorrhea and chlamydia, if: ?You are sexually active and are younger than 66 years of age. ?You are older than 66 years of age and your health care provider tells you that you are at risk for this type of infection. ?Your sexual activity has changed since you were last screened, and you are at increased risk for chlamydia or gonorrhea. Ask your health care provider if you are at risk. ?Ask your health care provider about whether you are at high risk for HIV. Your health care provider may recommend a prescription medicine to help prevent HIV  infection. If you choose to take medicine to prevent HIV, you should first get tested for HIV. You should then be tested every 3 months for as long as you are taking the medicine. ?Pregnancy ?If you are about to stop having your period (premenopausal) and you may become pregnant, seek counseling before you get pregnant. ?Take 400 to 800  micrograms (mcg) of folic acid every day if you become pregnant. ?Ask for birth control (contraception) if you want to prevent pregnancy. ?Osteoporosis and menopause ?Osteoporosis is a disease in which the bones lose minerals and strength with aging. This can result in bone fractures. If you are 46 years old or older, or if you are at risk for osteoporosis and fractures, ask your health care provider if you should: ?Be screened for bone loss. ?Take a calcium or vitamin D supplement to lower your risk of fractures. ?Be given hormone replacement therapy (HRT) to treat symptoms of menopause. ?Follow these instructions at home: ?Alcohol use ?Do not drink alcohol if: ?Your health care provider tells you not to drink. ?You are pregnant, may be pregnant, or are planning to become pregnant. ?If you drink alcohol: ?Limit how much you have to: ?0-1 drink a day. ?Know how much alcohol is in your drink. In the [place] one drink equals one 12 oz bottle of beer (355 mL), one 5 oz glass of wine (148 mL), or one 1? oz glass of hard liquor (44 mL). ?Lifestyle ?Do not use any products that contain nicotine or tobacco. These products include cigarettes, chewing tobacco, and vaping devices, such as e-cigarettes. If you need help quitting, ask your health care provider. ?Do not use street drugs. ?Do not share needles. ?Ask your health care provider for help if you need support or information about quitting drugs. ?General instructions ?Schedule regular health, dental, and eye exams. ?Stay current with your vaccines. ?Tell your health care provider if: ?You often feel depressed. ?You have ever been abused or do not feel safe at home. ?Summary ?Adopting a healthy lifestyle and getting preventive care are important in promoting health and wellness. ?Follow your health care provider's instructions about healthy diet, exercising, and getting tested or screened for diseases. ?Follow your health care provider's instructions on monitoring your  cholesterol and blood pressure. ?This information is not intended to replace advice given to you by your health care provider. Make sure you discuss any questions you have with your health care provider. ?Document Revised: 04/24/2021 Document Reviewed: 04/24/2021 ?Elsevier Patient Education ? [place]. ? ? ?Pneumococcal Conjugate Vaccine: What You Need to Know ?1. Why get vaccinated? ?Pneumococcal conjugate vaccine can prevent pneumococcal disease. ?Pneumococcal disease refers to any illness caused by pneumococcal bacteria. These bacteria can cause many types of illnesses, including pneumonia, which is an infection of the lungs. Pneumococcal bacteria are one of the most common causes of pneumonia. ?Besides pneumonia, pneumococcal bacteria can also cause: ?Ear infections ?Sinus infections ?Meningitis (infection of the tissue covering the brain and spinal cord) ?Bacteremia (infection of the blood) ?Anyone can get pneumococcal disease, but children under 5 years old, people with certain medical conditions or other risk factors, and adults 65 years or older are at the highest risk. ?Most pneumococcal infections are mild. However, some can result in long-term problems, such as brain damage or hearing loss. Meningitis, bacteremia, and pneumonia caused by pneumococcal disease can be fatal. ?2. Pneumococcal conjugate vaccine ?Pneumococcal conjugate vaccine helps protect against bacteria that cause pneumococcal disease. There are three pneumococcal conjugate vaccines (PCV13,  PCV15, and PCV20). The different vaccines are recommended for different people based on their age and medical status. ?PCV13 ?Infants and young children usually need 4 doses of PCV13, at ages 46, 88, 59, and 28-15 months. ?Older children (through age 61 months) may be vaccinated with PCV13 if they did not receive the recommended doses. ?Children and adolescents 70-34 years of age with certain medical conditions should receive a single dose of PCV13  if they did not already receive PCV13. ?PCV15 or PCV20 ?Adults 93 through 66 years old with certain medical conditions or other risk factors who have not already received a pneumococcal conjugate vaccine shou

## 2022-04-03 LAB — CBC
Hematocrit: 43.3 % (ref 34.0–46.6)
Hemoglobin: 13.9 g/dL (ref 11.1–15.9)
MCH: 29.1 pg (ref 26.6–33.0)
MCHC: 32.1 g/dL (ref 31.5–35.7)
MCV: 91 fL (ref 79–97)
Platelets: 274 10*3/uL (ref 150–450)
RBC: 4.77 x10E6/uL (ref 3.77–5.28)
RDW: 12.5 % (ref 11.7–15.4)
WBC: 7.1 10*3/uL (ref 3.4–10.8)

## 2022-04-03 LAB — LIPID PANEL
Chol/HDL Ratio: 2.4 ratio (ref 0.0–4.4)
Cholesterol, Total: 240 mg/dL — ABNORMAL HIGH (ref 100–199)
HDL: 98 mg/dL (ref >39)
LDL Chol Calc (NIH): 129 mg/dL — ABNORMAL HIGH (ref 0–99)
Triglycerides: 77 mg/dL (ref 0–149)
VLDL Cholesterol Cal: 13 mg/dL (ref 5–40)

## 2022-04-03 LAB — CMP14+EGFR
ALT: 17 IU/L (ref 0–32)
AST: 15 IU/L (ref 0–40)
Albumin/Globulin Ratio: 1.6 (ref 1.2–2.2)
Albumin: 4.6 g/dL (ref 3.8–4.8)
Alkaline Phosphatase: 68 IU/L (ref 44–121)
BUN/Creatinine Ratio: 14 (ref 12–28)
BUN: 11 mg/dL (ref 8–27)
Bilirubin Total: 1.1 mg/dL (ref 0.0–1.2)
CO2: 27 mmol/L (ref 20–29)
Calcium: 10.2 mg/dL (ref 8.7–10.3)
Chloride: 103 mmol/L (ref 96–106)
Creatinine, Ser: 0.8 mg/dL (ref 0.57–1.00)
Globulin, Total: 2.8 g/dL (ref 1.5–4.5)
Glucose: 88 mg/dL (ref 70–99)
Potassium: 4.7 mmol/L (ref 3.5–5.2)
Sodium: 142 mmol/L (ref 134–144)
Total Protein: 7.4 g/dL (ref 6.0–8.5)
eGFR: 82 mL/min/{1.73_m2} (ref >59)

## 2022-04-03 LAB — VITAMIN B12: Vitamin B-12: 574 pg/mL (ref 232–1245)

## 2022-04-03 LAB — HEMOGLOBIN A1C
Est. average glucose Bld gHb Est-mCnc: 103 mg/dL
Hgb A1c MFr Bld: 5.2 % (ref 4.8–5.6)

## 2022-04-03 LAB — TSH: TSH: 1.55 u[IU]/mL (ref 0.450–4.500)

## 2022-06-05 ENCOUNTER — Ambulatory Visit: Payer: Medicare PPO

## 2022-08-14 DIAGNOSIS — H2513 Age-related nuclear cataract, bilateral: Secondary | ICD-10-CM | POA: Diagnosis not present

## 2022-08-21 ENCOUNTER — Other Ambulatory Visit: Payer: Self-pay

## 2022-08-21 DIAGNOSIS — H2513 Age-related nuclear cataract, bilateral: Secondary | ICD-10-CM | POA: Diagnosis not present

## 2022-08-21 DIAGNOSIS — Z1211 Encounter for screening for malignant neoplasm of colon: Secondary | ICD-10-CM

## 2022-09-06 DIAGNOSIS — H269 Unspecified cataract: Secondary | ICD-10-CM | POA: Diagnosis not present

## 2022-09-06 DIAGNOSIS — H2512 Age-related nuclear cataract, left eye: Secondary | ICD-10-CM | POA: Diagnosis not present

## 2022-09-08 ENCOUNTER — Encounter: Payer: Self-pay | Admitting: Nurse Practitioner

## 2022-09-27 DIAGNOSIS — H2513 Age-related nuclear cataract, bilateral: Secondary | ICD-10-CM | POA: Diagnosis not present

## 2022-09-27 DIAGNOSIS — H269 Unspecified cataract: Secondary | ICD-10-CM | POA: Diagnosis not present

## 2022-09-27 DIAGNOSIS — H2511 Age-related nuclear cataract, right eye: Secondary | ICD-10-CM | POA: Diagnosis not present

## 2022-09-27 DIAGNOSIS — H25811 Combined forms of age-related cataract, right eye: Secondary | ICD-10-CM | POA: Diagnosis not present

## 2022-10-09 ENCOUNTER — Telehealth: Payer: Self-pay | Admitting: Nurse Practitioner

## 2022-10-09 NOTE — Telephone Encounter (Signed)
Left message for patient to call back and schedule Medicare Annual Wellness Visit (AWV) either virtually or in office. Left  my Angel Holt number [phone]   **awvi 07/17/22 per [place] please schedule with Nurse Health Adviser   45 min for awv-i and in office appointments 30 min for awv-s  phone/virtual appointments

## 2022-10-15 NOTE — Telephone Encounter (Signed)
Returned patients call  Patient returned my call 10/12/22

## 2022-10-17 DIAGNOSIS — D175 Benign lipomatous neoplasm of intra-abdominal organs: Secondary | ICD-10-CM | POA: Diagnosis not present

## 2022-10-17 DIAGNOSIS — Z1211 Encounter for screening for malignant neoplasm of colon: Secondary | ICD-10-CM | POA: Diagnosis not present

## 2022-10-17 DIAGNOSIS — K635 Polyp of colon: Secondary | ICD-10-CM | POA: Diagnosis not present

## 2022-10-17 DIAGNOSIS — K648 Other hemorrhoids: Secondary | ICD-10-CM | POA: Diagnosis not present

## 2022-10-17 LAB — HM COLONOSCOPY

## 2022-10-23 ENCOUNTER — Ambulatory Visit
Admission: RE | Admit: 2022-10-23 | Discharge: 2022-10-23 | Disposition: A | Discharge status: Home / Self Care | Payer: Medicare PPO | Source: Ambulatory Visit | Attending: Nurse Practitioner | Admitting: Nurse Practitioner

## 2022-10-23 DIAGNOSIS — Z1231 Encounter for screening mammogram for malignant neoplasm of breast: Secondary | ICD-10-CM | POA: Diagnosis not present

## 2022-10-25 ENCOUNTER — Ambulatory Visit (INDEPENDENT_AMBULATORY_CARE_PROVIDER_SITE_OTHER): Payer: Medicare PPO | Admitting: Nurse Practitioner

## 2022-10-25 ENCOUNTER — Encounter: Payer: Self-pay | Admitting: Nurse Practitioner

## 2022-10-25 ENCOUNTER — Ambulatory Visit (INDEPENDENT_AMBULATORY_CARE_PROVIDER_SITE_OTHER): Payer: Medicare PPO

## 2022-10-25 VITALS — BP 118/78 | HR 74 | Temp 98.1°F | Ht 65.0 in | Wt 201.0 lb

## 2022-10-25 VITALS — BP 118/78 | HR 74 | Temp 98.1°F | Ht 65.8 in | Wt 201.2 lb

## 2022-10-25 DIAGNOSIS — M25512 Pain in left shoulder: Secondary | ICD-10-CM

## 2022-10-25 DIAGNOSIS — G8929 Other chronic pain: Secondary | ICD-10-CM | POA: Diagnosis not present

## 2022-10-25 DIAGNOSIS — Z23 Encounter for immunization: Secondary | ICD-10-CM

## 2022-10-25 DIAGNOSIS — Z0000 Encounter for general adult medical examination without abnormal findings: Secondary | ICD-10-CM

## 2022-10-25 NOTE — Progress Notes (Signed)
Angel Holt,acting as a Neurosurgeon for Angel Felts, FNP.,have documented all relevant documentation on the behalf of Angel Felts, FNP,as directed by  Angel Felts, FNP while in the presence of Angel Felts, FNP.    Subjective:     Patient ID: Angel Holt , female    DOB: [birth year] , 66 y.o.   MRN: [id number]   Chief Complaint  Patient presents with   Shoulder Pain    HPI  Patient presents today for shoulder pain, Patient states her left shoulder has been hurting for about 3 months. Patient states it is getting worse, patient isn't taking anything for it. Patient states her shoulder used to pop a lot and now feels heavy. She has limited range of motion.   BP Readings from Last 3 Encounters: 10/25/22 : 118/78 10/25/22 : 118/78 04/02/22 : 126/84    Shoulder Pain  The pain is present in the left shoulder. This is a chronic problem. The current episode started more than 1 month ago. She has tried acetaminophen (tylenol arthritis) for the symptoms. There is no history of diabetes.     Past Medical History:  Diagnosis Date   Thyroid disease      History reviewed. No pertinent family history.   Current Outpatient Medications:    Multiple Vitamin (MULTIVITAMIN WITH MINERALS) TABS tablet, Take 1 tablet by mouth daily., Disp: , Rfl:    No Known Allergies   Review of Systems  Constitutional: Negative.   Respiratory: Negative.    Cardiovascular: Negative.   Musculoskeletal:  Positive for arthralgias (left shoulder).  Psychiatric/Behavioral: Negative.       Today's Vitals   10/25/22 0857  BP: 118/78  Pulse: 74  Temp: 98.1 F (36.7 C)  TempSrc: Oral  Weight: 201 lb (91.2 kg)  Height: 5\' 5"  (1.651 m)   Body mass index is 33.45 kg/m.  Wt Readings from Last 3 Encounters:  10/25/22 201 lb (91.2 kg)  10/25/22 201 lb 3.2 oz (91.3 kg)  04/02/22 202 lb 9.6 oz (91.9 kg)    Objective:  Physical Exam Vitals reviewed.  Constitutional:      Appearance: Normal  appearance.  Cardiovascular:     Pulses: Normal pulses.     Heart sounds: Normal heart sounds.  Pulmonary:     Effort: Pulmonary effort is normal. No respiratory distress.     Breath sounds: Normal breath sounds. No wheezing.  Musculoskeletal:        General: Tenderness (anterior bursa of left shoulder tenderness) present. No swelling, deformity or signs of injury.     Comments: Left shoulder   Skin:    General: Skin is warm and dry.     Capillary Refill: Capillary refill takes less than 2 seconds.  Neurological:     General: No focal deficit present.     Mental Status: She is alert and oriented to person, place, and time.     Cranial Nerves: No cranial nerve deficit.     Motor: No weakness.         Assessment And Plan:     1. Chronic left shoulder pain Comments: She has decreased mobility and would a referral to orthopedics - Ambulatory referral to Orthopedic Surgery     Patient was given opportunity to ask questions. Patient verbalized understanding of the plan and was able to repeat key elements of the plan. All questions were answered to their satisfaction.  04/04/22, FNP   I, Angel Felts, FNP, have reviewed all documentation for this visit.  The documentation on 10/25/22 for the exam, diagnosis, procedures, and orders are all accurate and complete.   IF YOU HAVE BEEN REFERRED TO A SPECIALIST, IT MAY TAKE 1-2 WEEKS TO SCHEDULE/PROCESS THE REFERRAL. IF YOU HAVE NOT HEARD FROM US/SPECIALIST IN TWO WEEKS, PLEASE GIVE [place] A CALL AT [phone] X [phone].   THE PATIENT IS ENCOURAGED TO PRACTICE SOCIAL DISTANCING DUE TO THE COVID-19 PANDEMIC.

## 2022-10-25 NOTE — Patient Instructions (Signed)
Angel Holt , Thank you for taking time to come for your Medicare Wellness Visit. I appreciate your ongoing commitment to your health goals. Please review the following plan we discussed and let me know if I can assist you in the future.   Screening recommendations/referrals: Colonoscopy: cologuard ordered 08/21/2022 Mammogram: completed 10/23/2022, due 10/25/2023 Bone Density: due, Call [facility] [phone] Recommended yearly ophthalmology/optometry visit for glaucoma screening and checkup Recommended yearly dental visit for hygiene and checkup  Vaccinations: Influenza vaccine: today Pneumococcal vaccine: completed 04/02/2022 Tdap vaccine: completed 03/30/2021, due 03/31/2031 Shingles vaccine: discussed   Covid-19: 11/18/2020, 03/22/2020, 02/29/2020  Advanced directives: Advance directive discussed with you today. Even though you declined this today please call our office should you change your mind and we can give you the proper paperwork for you to fill out.   Conditions/risks identified: none  Next appointment: Follow up in one year for your annual wellness visit    Preventive Care 65 Years and Older, Female Preventive care refers to lifestyle choices and visits with your health care provider that can promote health and wellness. What does preventive care include? A yearly physical exam. This is also called an annual well check. Dental exams once or twice a year. Routine eye exams. Ask your health care provider how often you should have your eyes checked. Personal lifestyle choices, including: Daily care of your teeth and gums. Regular physical activity. Eating a healthy diet. Avoiding tobacco and drug use. Limiting alcohol use. Practicing safe sex. Taking low-dose aspirin every day. Taking vitamin and mineral supplements as recommended by your health care provider. What happens during an annual well check? The services and screenings done by your health care provider during  your annual well check will depend on your age, overall health, lifestyle risk factors, and family history of disease. Counseling  Your health care provider may ask you questions about your: Alcohol use. Tobacco use. Drug use. Emotional well-being. Home and relationship well-being. Sexual activity. Eating habits. History of falls. Memory and ability to understand (cognition). Work and work Astronomer. Reproductive health. Screening  You may have the following tests or measurements: Height, weight, and BMI. Blood pressure. Lipid and cholesterol levels. These may be checked every 5 years, or more frequently if you are over 42 years old. Skin check. Lung cancer screening. You may have this screening every year starting at age 75 if you have a 30-pack-year history of smoking and currently smoke or have quit within the past 15 years. Fecal occult blood test (FOBT) of the stool. You may have this test every year starting at age 46. Flexible sigmoidoscopy or colonoscopy. You may have a sigmoidoscopy every 5 years or a colonoscopy every 10 years starting at age 40. Hepatitis C blood test. Hepatitis B blood test. Sexually transmitted disease (STD) testing. Diabetes screening. This is done by checking your blood sugar (glucose) after you have not eaten for a while (fasting). You may have this done every 1-3 years. Bone density scan. This is done to screen for osteoporosis. You may have this done starting at age 75. Mammogram. This may be done every 1-2 years. Talk to your health care provider about how often you should have regular mammograms. Talk with your health care provider about your test results, treatment options, and if necessary, the need for more tests. Vaccines  Your health care provider may recommend certain vaccines, such as: Influenza vaccine. This is recommended every year. Tetanus, diphtheria, and acellular pertussis (Tdap, Td) vaccine. You may need  a Td booster every 10  years. Zoster vaccine. You may need this after age 27. Pneumococcal 13-valent conjugate (PCV13) vaccine. One dose is recommended after age 40. Pneumococcal polysaccharide (PPSV23) vaccine. One dose is recommended after age 64. Talk to your health care provider about which screenings and vaccines you need and how often you need them. This information is not intended to replace advice given to you by your health care provider. Make sure you discuss any questions you have with your health care provider. Document Released: 12/30/2015 Document Revised: 08/22/2016 Document Reviewed: 10/04/2015 Elsevier Interactive Patient Education  2017 [place] Prevention in the Home Falls can cause injuries. They can happen to people of all ages. There are many things you can do to make your home safe and to help prevent falls. What can I do on the outside of my home? Regularly fix the edges of walkways and driveways and fix any cracks. Remove anything that might make you trip as you walk through a door, such as a raised step or threshold. Trim any bushes or trees on the path to your home. Use bright outdoor lighting. Clear any walking paths of anything that might make someone trip, such as rocks or tools. Regularly check to see if handrails are loose or broken. Make sure that both sides of any steps have handrails. Any raised decks and porches should have guardrails on the edges. Have any leaves, snow, or ice cleared regularly. Use sand or salt on walking paths during winter. Clean up any spills in your garage right away. This includes oil or grease spills. What can I do in the bathroom? Use night lights. Install grab bars by the toilet and in the tub and shower. Do not use towel bars as grab bars. Use non-skid mats or decals in the tub or shower. If you need to sit down in the shower, use a plastic, non-slip stool. Keep the floor dry. Clean up any water that spills on the floor as soon as it  happens. Remove soap buildup in the tub or shower regularly. Attach bath mats securely with double-sided non-slip rug tape. Do not have throw rugs and other things on the floor that can make you trip. What can I do in the bedroom? Use night lights. Make sure that you have a light by your bed that is easy to reach. Do not use any sheets or blankets that are too big for your bed. They should not hang down onto the floor. Have a firm chair that has side arms. You can use this for support while you get dressed. Do not have throw rugs and other things on the floor that can make you trip. What can I do in the kitchen? Clean up any spills right away. Avoid walking on wet floors. Keep items that you use a lot in easy-to-reach places. If you need to reach something above you, use a strong step stool that has a grab bar. Keep electrical cords out of the way. Do not use floor polish or wax that makes floors slippery. If you must use wax, use non-skid floor wax. Do not have throw rugs and other things on the floor that can make you trip. What can I do with my stairs? Do not leave any items on the stairs. Make sure that there are handrails on both sides of the stairs and use them. Fix handrails that are broken or loose. Make sure that handrails are as long as the stairways. Check  any carpeting to make sure that it is firmly attached to the stairs. Fix any carpet that is loose or worn. Avoid having throw rugs at the top or bottom of the stairs. If you do have throw rugs, attach them to the floor with carpet tape. Make sure that you have a light switch at the top of the stairs and the bottom of the stairs. If you do not have them, ask someone to add them for you. What else can I do to help prevent falls? Wear shoes that: Do not have high heels. Have rubber bottoms. Are comfortable and fit you well. Are closed at the toe. Do not wear sandals. If you use a stepladder: Make sure that it is fully opened.  Do not climb a closed stepladder. Make sure that both sides of the stepladder are locked into place. Ask someone to hold it for you, if possible. Clearly mark and make sure that you can see: Any grab bars or handrails. First and last steps. Where the edge of each step is. Use tools that help you move around (mobility aids) if they are needed. These include: Canes. Walkers. Scooters. Crutches. Turn on the lights when you go into a dark area. Replace any light bulbs as soon as they burn out. Set up your furniture so you have a clear path. Avoid moving your furniture around. If any of your floors are uneven, fix them. If there are any pets around you, be aware of where they are. Review your medicines with your doctor. Some medicines can make you feel dizzy. This can increase your chance of falling. Ask your doctor what other things that you can do to help prevent falls. This information is not intended to replace advice given to you by your health care provider. Make sure you discuss any questions you have with your health care provider. Document Released: 09/29/2009 Document Revised: 05/10/2016 Document Reviewed: 01/07/2015 Elsevier Interactive Patient Education  2017 [employer].

## 2022-10-25 NOTE — Patient Instructions (Signed)

## 2022-10-25 NOTE — Progress Notes (Signed)
Subjective:   Angel Holt is a 66 y.o. female who presents for an Initial Medicare Annual Wellness Visit.  Review of Systems     Cardiac Risk Factors include: advanced age (>39men, >65 women);obesity (BMI >30kg/m2)     Objective:    Today's Vitals   10/25/22 0817 10/25/22 0822  BP: 118/78   Pulse: 74   Temp: 98.1 F (36.7 C)   TempSrc: Oral   SpO2: 97%   Weight: 201 lb 3.2 oz (91.3 kg)   Height: 5' 5.8" (1.671 m)   PainSc:  8    Body mass index is 32.67 kg/m.     10/25/2022    8:27 AM 09/19/2019    1:19 AM 07/02/2018    8:40 PM 09/24/2015   11:41 PM  Advanced Directives  Does Patient Have a Medical Advance Directive? No No No No  Would patient like information on creating a medical advance directive? No - Patient declined   No - patient declined information    Current Medications (verified) Outpatient Encounter Medications as of 10/25/2022  Medication Sig   Multiple Vitamin (MULTIVITAMIN WITH MINERALS) TABS tablet Take 1 tablet by mouth daily.   No facility-administered encounter medications on file as of 10/25/2022.    Allergies (verified) Patient has no known allergies.   History: Past Medical History:  Diagnosis Date   Thyroid disease    Past Surgical History:  Procedure Laterality Date   ABDOMINAL HYSTERECTOMY     History reviewed. No pertinent family history. Social History   Socioeconomic History   Marital status: Married    Spouse name: Not on file   Number of children: Not on file   Years of education: Not on file   Highest education level: Not on file  Occupational History   Not on file  Tobacco Use   Smoking status: Former   Smokeless tobacco: Never  Vaping Use   Vaping Use: Never used  Substance and Sexual Activity   Alcohol use: No    Comment: occasional wine   Drug use: No   Sexual activity: Not on file  Other Topics Concern   Not on file  Social History Narrative   Not on file   Social Determinants of Health    Financial Resource Strain: Low Risk  (10/25/2022)   Overall Financial Resource Strain (CARDIA)    Difficulty of Paying Living Expenses: Not hard at all  Food Insecurity: No Food Insecurity (10/25/2022)   Hunger Vital Sign    Worried About Running Out of Food in the Last Year: Never true    Ran Out of Food in the Last Year: Never true  Transportation Needs: No Transportation Needs (10/25/2022)   PRAPARE - Administrator, Civil Service (Medical): No    Lack of Transportation (Non-Medical): No  Physical Activity: Inactive (10/25/2022)   Exercise Vital Sign    Days of Exercise per Week: 0 days    Minutes of Exercise per Session: 0 min  Stress: No Stress Concern Present (10/25/2022)   [employer] of Occupational Health - Occupational Stress Questionnaire    Feeling of Stress : Not at all  Social Connections: Not on file    Tobacco Counseling Counseling given: Not Answered   Clinical Intake:  Pre-visit preparation completed: Yes  Pain : 0-10 Pain Score: 8  Pain Type: Acute pain Pain Location: Shoulder Pain Orientation: Left Pain Descriptors / Indicators: Aching Pain Onset: More than a month ago Pain Frequency: Constant  Nutritional Status: BMI > 30  Obese Nutritional Risks: None Diabetes: No  How often do you need to have someone help you when you read instructions, pamphlets, or other written materials from your doctor or pharmacy?: 1 - Never What is the last grade level you completed in school?: 10th grade  Diabetic? no  Interpreter Needed?: No  Information entered by :: NAllen LPN   Activities of Daily Living    10/25/2022    8:28 AM  In your present state of health, do you have any difficulty performing the following activities:  Hearing? 0  Vision? 0  Difficulty concentrating or making decisions? 0  Walking or climbing stairs? 0  Dressing or bathing? 0  Doing errands, shopping? 0  Preparing Food and eating ? N  Using the Toilet? N   In the past six months, have you accidently leaked urine? N  Do you have problems with loss of bowel control? N  Managing your Medications? N  Managing your Finances? N  Housekeeping or managing your Housekeeping? N    Patient Care Team: [name], FNP as PCP - General (General Practice)  Indicate any recent Medical Services you may have received from other than [facility] providers in the past year (date may be approximate).     Assessment:   This is a routine wellness examination for Angel Holt.  Hearing/Vision screen Vision Screening - Comments:: Regular eye exams, [facility]  Dietary issues and exercise activities discussed: Current Exercise Habits: The patient does not participate in regular exercise at present   Goals Addressed             This Visit's Progress    Patient Stated       10/25/2022, wants to eat better       Depression Screen    10/25/2022    8:27 AM 04/02/2022    2:04 PM 06/09/2020    2:21 PM 06/08/2020    5:42 PM 03/29/2020    3:06 PM 12/23/2019    3:00 PM  PHQ 2/9 Scores  PHQ - 2 Score 0 0 3 6 0 0  PHQ- 9 Score   12 14      Fall Risk    10/25/2022    8:27 AM 04/02/2022    2:04 PM 06/08/2020    5:42 PM 03/29/2020    3:06 PM 12/23/2019    3:00 PM  Fall Risk   Falls in the past year? 0 0 0 0 0  Number falls in past yr: 0 0 0    Injury with Fall? 0 0 0    Risk for fall due to : No Fall Risks      Follow up Falls prevention discussed;Education provided;Falls evaluation completed        FALL RISK PREVENTION PERTAINING TO THE HOME:  Any stairs in or around the home? Yes  If so, are there any without handrails? No  Home free of loose throw rugs in walkways, pet beds, electrical cords, etc? Yes  Adequate lighting in your home to reduce risk of falls? Yes   ASSISTIVE DEVICES UTILIZED TO PREVENT FALLS:  Life alert? No  Use of a cane, walker or w/c? No  Grab bars in the bathroom? Yes  Shower chair or bench in shower? No  Elevated toilet seat or  a handicapped toilet? No   TIMED UP AND GO:  Was the test performed? Yes .  Length of time to ambulate 10 feet: 5 sec.   Gait steady  and fast without use of assistive device  Cognitive Function:        10/25/2022    8:28 AM  6CIT Screen  What Year? 0 points  What month? 0 points  What time? 0 points  Count back from 20 0 points  Months in reverse 0 points  Repeat phrase 2 points  Total Score 2 points    Immunizations Immunization History  Administered Date(s) Administered   Fluad Quad(high Dose 65+) 10/25/2022   Influenza,inj,Quad PF,6+ Mos 12/23/2019   Moderna Sars-Covid-2 Vaccination 11/18/2020   PFIZER(Purple Top)SARS-COV-2 Vaccination 02/29/2020, 03/22/2020   PNEUMOCOCCAL CONJUGATE-20 04/02/2022   Tdap 03/30/2021    TDAP status: Up to date  Flu Vaccine status: Completed at today's visit  Pneumococcal vaccine status: Up to date  Covid-19 vaccine status: Completed vaccines  Qualifies for Shingles Vaccine? Yes   Zostavax completed No   Shingrix Completed?: No.    Education has been provided regarding the importance of this vaccine. Patient has been advised to call insurance company to determine out of pocket expense if they have not yet received this vaccine. Advised may also receive vaccine at local pharmacy or Health Dept. Verbalized acceptance and understanding.  Screening Tests Health Maintenance  Topic Date Due   Medicare Annual Wellness (AWV)  Never done   COLONOSCOPY (Pts 45-75yrs Insurance coverage will need to be confirmed)  Never done   Zoster Vaccines- Shingrix (1 of 2) Never done   COVID-19 Vaccine (4 - Pfizer series) 01/13/2021   DEXA SCAN  Never done   MAMMOGRAM  10/23/2024   TETANUS/TDAP  03/31/2031   Pneumonia Vaccine 55+ Years old  Completed   INFLUENZA VACCINE  Completed   Hepatitis C Screening  Completed   HPV VACCINES  Aged Out    Health Maintenance  Health Maintenance Due  Topic Date Due   Medicare Annual Wellness (AWV)  Never  done   COLONOSCOPY (Pts 45-60yrs Insurance coverage will need to be confirmed)  Never done   Zoster Vaccines- Shingrix (1 of 2) Never done   COVID-19 Vaccine (4 - Pfizer series) 01/13/2021   DEXA SCAN  Never done    Colorectal cancer screening: Type of screening: Colonoscopy. Completed 10/17/2022. Repeat every 5-10 years  Mammogram status: Completed 10/23/2022. Repeat every year  Bone Density status: Ordered 04/02/2022. Pt provided with contact info and advised to call to schedule appt.  Lung Cancer Screening: (Low Dose CT Chest recommended if Age 32-80 years, 30 pack-year currently smoking OR have quit w/in 15years.) does not qualify.   Lung Cancer Screening Referral: no  Additional Screening:  Hepatitis C Screening: does qualify; Completed 12/23/2019  Vision Screening: Recommended annual ophthalmology exams for early detection of glaucoma and other disorders of the eye. Is the patient up to date with their annual eye exam?  Yes  Who is the provider or what is the name of the office in which the patient attends annual eye exams? [facility] If pt is not established with a provider, would they like to be referred to a provider to establish care? No .   Dental Screening: Recommended annual dental exams for proper oral hygiene  Community Resource Referral / Chronic Care Management: CRR required this visit?  No   CCM required this visit?  No      Plan:     I have personally reviewed and noted the following in the patient's chart:   Medical and social history Use of alcohol, tobacco or illicit drugs  Current medications  and supplements including opioid prescriptions. Patient is not currently taking opioid prescriptions. Functional ability and status Nutritional status Physical activity Advanced directives List of other physicians Hospitalizations, surgeries, and ER visits in previous 12 months Vitals Screenings to include cognitive, depression, and falls Referrals and  appointments  In addition, I have reviewed and discussed with patient certain preventive protocols, quality metrics, and best practice recommendations. A written personalized care plan for preventive services as well as general preventive health recommendations were provided to patient.     Barb Merino, LPN   [id number]   Nurse Notes: complains of left shoulder giving her pain and getting progressively worse. She is going to make an appointment to see Janece. If too far out she will go to urgent care.

## 2022-10-29 ENCOUNTER — Ambulatory Visit (INDEPENDENT_AMBULATORY_CARE_PROVIDER_SITE_OTHER): Payer: Medicare PPO

## 2022-10-29 ENCOUNTER — Encounter: Payer: Self-pay | Admitting: Orthopedic Surgery

## 2022-10-29 ENCOUNTER — Ambulatory Visit: Payer: Self-pay

## 2022-10-29 ENCOUNTER — Ambulatory Visit (INDEPENDENT_AMBULATORY_CARE_PROVIDER_SITE_OTHER): Payer: Medicare PPO | Admitting: Orthopedic Surgery

## 2022-10-29 DIAGNOSIS — M25512 Pain in left shoulder: Secondary | ICD-10-CM

## 2022-10-29 DIAGNOSIS — M7502 Adhesive capsulitis of left shoulder: Secondary | ICD-10-CM | POA: Diagnosis not present

## 2022-11-01 ENCOUNTER — Encounter: Payer: Self-pay | Admitting: Orthopedic Surgery

## 2022-11-01 MED ORDER — LIDOCAINE HCL 1 % IJ SOLN
5.0000 mL | INTRAMUSCULAR | Status: AC | PRN
  Administered 2022-10-29: 5 mL

## 2022-11-01 MED ORDER — METHYLPREDNISOLONE ACETATE 40 MG/ML IJ SUSP
40.0000 mg | INTRAMUSCULAR | Status: AC | PRN
  Administered 2022-10-29: 40 mg via INTRA_ARTICULAR

## 2022-11-01 MED ORDER — BUPIVACAINE HCL 0.5 % IJ SOLN
9.0000 mL | INTRAMUSCULAR | Status: AC | PRN
  Administered 2022-10-29: 9 mL via INTRA_ARTICULAR

## 2022-11-01 NOTE — Progress Notes (Signed)
Office Visit Note   Patient: Angel Holt           Date of Birth: [birth year]           MRN: [id number] Visit Date: 10/29/2022 Requested by: [name], FNP [address] [place],  [place] [address] PCP: [name], FNP  Subjective: Chief Complaint  Patient presents with   Left Shoulder - Pain    HPI: Angel Holt is a 66 y.o. female who presents to the office reporting left shoulder pain.  The pain started about 3 months ago.  Became worse in September.  Describes decreased range of motion at this time.  She is right-hand dominant and retired.  Denies any history of injury.  Pain and stiffness progressively worsening.  Denies any radicular symptoms.  Denies scapular pain.  Has taken some over-the-counter medication with minimal relief..                ROS: All systems reviewed are negative as they relate to the chief complaint within the history of present illness.  Patient denies fevers or chills.  Assessment & Plan: Visit Diagnoses:  1. Left shoulder pain, unspecified chronicity     Plan: Impression is left frozen shoulder with diminished passive range of motion and maintain rotator cuff strength.  Plan is ultrasound-guided glenohumeral joint injection today with home exercise program.  Follow-up in 6 weeks for clinical recheck and decision for or against repeat injection based on restoration of higher level of motion as well as decreased pain.  Follow-Up Instructions: No follow-ups on file.   Orders:  Orders Placed This Encounter  Procedures   XR Shoulder Left   US Guided Needle Placement - No Linked Charges   No orders of the defined types were placed in this encounter.     Procedures: Large Joint Inj: L glenohumeral on 10/29/2022 10:43 PM Indications: diagnostic evaluation and pain Details: 18 G 1.5 in needle, ultrasound-guided posterior approach  Arthrogram: No  Medications: 9 mL bupivacaine 0.5 %; 40 mg methylPREDNISolone acetate 40 MG/ML; 5  mL lidocaine 1 % Outcome: tolerated well, no immediate complications Procedure, treatment alternatives, risks and benefits explained, specific risks discussed. Consent was given by the patient. Immediately prior to procedure a time out was called to verify the correct patient, procedure, equipment, support staff and site/side marked as required. Patient was prepped and draped in the usual sterile fashion.       Clinical Data: No additional findings.  Objective: Vital Signs: There were no vitals taken for this visit.  Physical Exam:  Constitutional: Patient appears well-developed HEENT:  Head: Normocephalic Eyes:EOM are normal Neck: Normal range of motion Cardiovascular: Normal rate Pulmonary/chest: Effort normal Neurologic: Patient is alert Skin: Skin is warm Psychiatric: Patient has normal mood and affect  Ortho Exam: Ortho exam demonstrates good rotator cuff strength in the left infraspinatus supraspinatus subscap muscle testing.  No masses lymphadenopathy or skin changes noted in that left shoulder region.  Deltoid fires.  Range of motion on the left is 30/50/100.  Range of motion on the right is 90/110/180.  No coarse grinding or crepitus present with internal/external rotation of that left arm.  Biceps tendon nontender in the groove but does have some tenderness on O'Brien's testing.  Specialty Comments:  No specialty comments available.  Imaging: No results found.   PMFS History: Patient Active Problem List   Diagnosis Date Noted   PANIC DISORDER 12/30/2008   CARPAL TUNNEL SYNDROME, BILATERAL, HX OF 12/30/2008  Past Medical History:  Diagnosis Date   Thyroid disease     History reviewed. No pertinent family history.  Past Surgical History:  Procedure Laterality Date   ABDOMINAL HYSTERECTOMY     Social History   Occupational History   Not on file  Tobacco Use   Smoking status: Former   Smokeless tobacco: Never  Vaping Use   Vaping Use: Never used   Substance and Sexual Activity   Alcohol use: No    Comment: occasional wine   Drug use: No   Sexual activity: Not on file

## 2022-12-03 ENCOUNTER — Encounter: Payer: Self-pay | Admitting: Nurse Practitioner

## 2022-12-05 ENCOUNTER — Ambulatory Visit: Payer: Medicare PPO | Admitting: Orthopedic Surgery

## 2022-12-05 DIAGNOSIS — M7502 Adhesive capsulitis of left shoulder: Secondary | ICD-10-CM | POA: Diagnosis not present

## 2022-12-06 ENCOUNTER — Encounter: Payer: Self-pay | Admitting: Orthopedic Surgery

## 2022-12-06 NOTE — Progress Notes (Signed)
Plan  Office Visit Note   Patient: Angel Holt           Date of Birth: 08-28-1956           MRN: [id number] Visit Date: 12/05/2022 Requested by: [name], FNP [address] [place],  [place] [address] PCP: [name], FNP  Subjective: Chief Complaint  Patient presents with  . Left Shoulder - Follow-up    HPI: Angel Holt is a 66 y.o. female who presents to the office reporting ***.                ROS: All systems reviewed are negative as they relate to the chief complaint within the history of present illness.  Patient denies fevers or chills.  Assessment & Plan: Visit Diagnoses:  1. Adhesive capsulitis of left shoulder     Plan: ***  Follow-Up Instructions: No follow-ups on file.   Orders:  No orders of the defined types were placed in this encounter.  No orders of the defined types were placed in this encounter.     Procedures: No procedures performed   Clinical Data: No additional findings.  Objective: Vital Signs: There were no vitals taken for this visit.  Physical Exam:  Constitutional: Patient appears well-developed HEENT:  Head: Normocephalic Eyes:EOM are normal Neck: Normal range of motion Cardiovascular: Normal rate Pulmonary/chest: Effort normal Neurologic: Patient is alert Skin: Skin is warm Psychiatric: Patient has normal mood and affect  Ortho Exam: ***  Specialty Comments:  No specialty comments available.  Imaging: No results found.   PMFS History: Patient Active Problem List   Diagnosis Date Noted  . PANIC DISORDER 12/30/2008  . CARPAL TUNNEL SYNDROME, BILATERAL, HX OF 12/30/2008   Past Medical History:  Diagnosis Date  . Thyroid disease     History reviewed. No pertinent family history.  Past Surgical History:  Procedure Laterality Date  . ABDOMINAL HYSTERECTOMY     Social History   Occupational History  . Not on file  Tobacco Use  . Smoking status: Former  . Smokeless tobacco: Never   Vaping Use  . Vaping Use: Never used  Substance and Sexual Activity  . Alcohol use: No    Comment: occasional wine  . Drug use: No  . Sexual activity: Not on file

## 2023-04-03 ENCOUNTER — Encounter: Payer: Self-pay | Admitting: Nurse Practitioner

## 2023-04-03 ENCOUNTER — Encounter: Payer: Medicare PPO | Admitting: Nurse Practitioner

## 2023-04-03 ENCOUNTER — Ambulatory Visit (INDEPENDENT_AMBULATORY_CARE_PROVIDER_SITE_OTHER): Payer: Medicare PPO | Admitting: Nurse Practitioner

## 2023-04-03 VITALS — BP 102/58 | HR 73 | Temp 98.0°F | Ht 65.0 in | Wt 205.0 lb

## 2023-04-03 DIAGNOSIS — E2839 Other primary ovarian failure: Secondary | ICD-10-CM | POA: Diagnosis not present

## 2023-04-03 DIAGNOSIS — R200 Anesthesia of skin: Secondary | ICD-10-CM

## 2023-04-03 DIAGNOSIS — Z860100 Personal history of colon polyps, unspecified: Secondary | ICD-10-CM | POA: Insufficient documentation

## 2023-04-03 DIAGNOSIS — E7800 Pure hypercholesterolemia, unspecified: Secondary | ICD-10-CM | POA: Diagnosis not present

## 2023-04-03 DIAGNOSIS — Z23 Encounter for immunization: Secondary | ICD-10-CM | POA: Diagnosis not present

## 2023-04-03 MED ORDER — SHINGRIX 50 MCG/0.5ML IM SUSR
0.5000 mL | Freq: Once | INTRAMUSCULAR | 0 refills | Status: AC
Start: 2023-04-03 — End: 2023-04-03

## 2023-04-03 NOTE — Progress Notes (Signed)
I,Sheena H Holbrook,acting as a Neurosurgeon for [name], FNP.,have documented all relevant documentation on the behalf of [name], FNP,as directed by  [name], FNP while in the presence of [name], FNP.   Subjective:     Patient ID: Angel Holt , female    DOB: 07/28/1956 , 67 y.o.   MRN: [id number]   Chief Complaint  Patient presents with   Welcome to Medicare    HPI  She is here to follow up on her cholesterol. She has had cataract surgery to both eyes since her last office visit. She is not able to get her physical today due to not 366 after last one. She had a AWV with [facility] in November 2023 next due in November 2024  Wt Readings from Last 3 Encounters: 04/03/23 : 205 lb (93 kg) 10/25/22 : 201 lb (91.2 kg) 10/25/22 : 201 lb 3.2 oz (91.3 kg)  She has started line dancing last week.      Past Medical History:  Diagnosis Date   Thyroid disease      History reviewed. No pertinent family history.   Current Outpatient Medications:    Multiple Vitamin (MULTIVITAMIN WITH MINERALS) TABS tablet, Take 1 tablet by mouth daily., Disp: , Rfl:    Zoster Vaccine Adjuvanted (SHINGRIX) injection, Inject 0.5 mLs into the muscle once for 1 dose., Disp: 0.5 mL, Rfl: 0   No Known Allergies    Review of Systems  Constitutional: Negative.   HENT: Negative.    Eyes: Negative.   Respiratory: Negative.    Cardiovascular: Negative.   Gastrointestinal: Negative.   Endocrine: Negative.   Genitourinary: Negative.   Musculoskeletal: Negative.   Skin: Negative.   Allergic/Immunologic: Negative.   Neurological:  Positive for numbness (left lateral foot and 5th metatarsal).  Hematological: Negative.   Psychiatric/Behavioral: Negative.       Today's Vitals   04/03/23 0921  BP: (!) 102/58  Pulse: 73  Temp: 98 F (36.7 C)  TempSrc: Oral  SpO2: 98%  Weight: 205 lb (93 kg)  Height:  (1.651 m)   Body mass index is 34.11 kg/m.   Objective:  Physical  Exam Vitals reviewed.  Constitutional:      General: She is not in acute distress.    Appearance: Normal appearance. She is obese.  Cardiovascular:     Pulses: Normal pulses.     Heart sounds: Normal heart sounds. No murmur heard. Pulmonary:     Effort: Pulmonary effort is normal. No respiratory distress.     Breath sounds: Normal breath sounds. No wheezing.  Skin:    General: Skin is warm and dry.     Capillary Refill: Capillary refill takes less than 2 seconds.  Neurological:     General: No focal deficit present.     Mental Status: She is alert and oriented to person, place, and time.     Cranial Nerves: No cranial nerve deficit.     Motor: No weakness.         Assessment And Plan:     1. Elevated cholesterol Comments: Cholesterol levels were slightly elevated at last visit advised to limit intake of fried and fatty foods. - Lipid panel  2. Numbness of left foot Comments: this has been persistent will refer to Podiatry. At this time it does not affect her daily lifestyle but has been persistent. - Ambulatory referral to Podiatry  3. Need for COVID-19 vaccine Covid 19 vaccine given in office observed for 15 minutes  without any adverse reaction [employer] Fall 2023 Covid-19 Vaccine 42yrs and older  4. Decreased estrogen level Comments: Will order bone density again did not receive a call from the last order. - DG Bone Density; Future  5. Need for shingles vaccine Checked transrx and declined, sent to pharmacy - Zoster Vaccine Adjuvanted [facility]) injection; Inject 0.5 mLs into the muscle once for 1 dose.  Dispense: 0.5 mL; Refill: 0   Patient was given opportunity to ask questions. Patient verbalized understanding of the plan and was able to repeat key elements of the plan. All questions were answered to their satisfaction.   [name], FNP   I, [name], FNP, have reviewed all documentation for this visit. The documentation on 04/03/23 for the exam,  diagnosis, procedures, and orders are all accurate and complete.   THE PATIENT IS ENCOURAGED TO PRACTICE SOCIAL DISTANCING DUE TO THE COVID-19 PANDEMIC.

## 2023-04-04 LAB — LIPID PANEL
Chol/HDL Ratio: 3 ratio (ref 0.0–4.4)
Cholesterol, Total: 242 mg/dL — ABNORMAL HIGH (ref 100–199)
HDL: 81 mg/dL (ref >39)
LDL Chol Calc (NIH): 148 mg/dL — ABNORMAL HIGH (ref 0–99)
Triglycerides: 76 mg/dL (ref 0–149)
VLDL Cholesterol Cal: 13 mg/dL (ref 5–40)

## 2023-04-18 ENCOUNTER — Ambulatory Visit: Payer: Medicare PPO | Admitting: Podiatry

## 2023-04-18 DIAGNOSIS — Q828 Other specified congenital malformations of skin: Secondary | ICD-10-CM | POA: Diagnosis not present

## 2023-04-18 NOTE — Progress Notes (Signed)
  Subjective:  Patient ID: Angel Holt, female    DOB: 1956/04/15,  MRN: [id number]  Chief Complaint  Patient presents with   Callouses    67 y.o. female presents with the above complaint.  Patient presents with bilateral submetatarsal 5 porokeratotic lesion painful to touch is progressive gotten worse worse with ambulation worse with pressure she would like for me to debride down.  She states that has been present for quite some time she has not seen anyone as prior to seeing me for this.   Review of Systems: Negative except as noted in the HPI. Denies N/V/F/Ch.  Past Medical History:  Diagnosis Date   Thyroid disease     Current Outpatient Medications:    Multiple Vitamin (MULTIVITAMIN WITH MINERALS) TABS tablet, Take 1 tablet by mouth daily., Disp: , Rfl:   Social History   Tobacco Use  Smoking Status Former  Smokeless Tobacco Never    No Known Allergies Objective:  There were no vitals filed for this visit. There is no height or weight on file to calculate BMI. Constitutional Well developed. Well nourished.  Vascular Dorsalis pedis pulses palpable bilaterally. Posterior tibial pulses palpable bilaterally. Capillary refill normal to all digits.  No cyanosis or clubbing noted. Pedal hair growth normal.  Neurologic Normal speech. Oriented to person, place, and time. Epicritic sensation to light touch grossly present bilaterally.  Dermatologic Bilateral submetatarsal 5 porokeratotic lesion painful to touch after nucleated core noted.  No pinpoint bleeding noted upon debridement  Orthopedic: Normal joint ROM without pain or crepitus bilaterally. No visible deformities. No bony tenderness.   Radiographs: None Assessment:   1. Porokeratosis    Plan:  Patient was evaluated and treated and all questions answered.  Bilateral submetatarsal 5 porokeratosis -I explained to the patient the etiology of porokeratosis and pressure med options were discussed.  Given the  amount of pain that she is having she will benefit from aggressive debridement of the lesion using chisel blade to handle.  The lesion was debrided down to healthy striated tissue -Shoe gear modification was discussed  No follow-ups on file.

## 2023-10-02 NOTE — Progress Notes (Signed)
Madelaine Bhat, CMA,acting as a Neurosurgeon for [name], FNP.,have documented all relevant documentation on the behalf of [name], FNP,as directed by  [name], FNP while in the presence of [name], FNP.  Subjective:    Patient ID: Angel Holt , female    DOB: [birth year] , 67 y.o.   MRN: [id number]  Chief Complaint  Patient presents with   Annual Exam    HPI  Patient presents today for HM, Patient reports compliance with medication. Patient denies any chest pain, SOB, or headaches. Patient has no concerns today.   BP Readings from Last 3 Encounters: 10/03/23 : 120/74 04/03/23 : (!) 102/58 10/25/22 : 118/78       Past Medical History:  Diagnosis Date   Thyroid disease      History reviewed. No pertinent family history.   Current Outpatient Medications:    Multiple Vitamin (MULTIVITAMIN WITH MINERALS) TABS tablet, Take 1 tablet by mouth daily., Disp: , Rfl:    No Known Allergies    The patient states she is status post hysterectomy.  Negative for: breast discharge, breast lump(s), breast pain and breast self exam. Associated symptoms include abnormal vaginal bleeding. Pertinent negatives include abnormal bleeding (hematology), anxiety, decreased libido, depression, difficulty falling sleep, dyspareunia, history of infertility, nocturia, sexual dysfunction, sleep disturbances, urinary incontinence, urinary urgency, vaginal discharge and vaginal itching. Diet regular.  The patient states her exercise level is minimal with dancing 2 times a week.    The patient's tobacco use is:  Social History   Tobacco Use  Smoking Status Former   Current packs/day: 0.00   Types: Cigarettes   Quit date: 12/12/1996   Years since quitting: 26.8  Smokeless Tobacco Never   She has been exposed to passive smoke. The patient's alcohol use is:  Social History   Substance and Sexual Activity  Alcohol Use Yes   Alcohol/week: 1.0 standard drink of alcohol   Types: 1 Glasses  of wine per week   Comment: Every once in a while.    Review of Systems  Constitutional: Negative.   HENT: Negative.    Eyes: Negative.   Respiratory: Negative.    Cardiovascular: Negative.   Gastrointestinal: Negative.   Endocrine: Negative.   Genitourinary: Negative.   Musculoskeletal: Negative.   Skin: Negative.   Allergic/Immunologic: Negative.   Neurological: Negative.   Hematological: Negative.   Psychiatric/Behavioral: Negative.       Today's Vitals   10/03/23 0938  BP: 120/74  Pulse: 80  Temp: 98 F (36.7 C)  TempSrc: Oral  Weight: 213 lb 12.8 oz (97 kg)  Height: 5\' 5"  (1.651 m)  PainSc: 0-No pain   Body mass index is 35.58 kg/m.  Wt Readings from Last 3 Encounters:  10/03/23 213 lb 12.8 oz (97 kg)  04/03/23 205 lb (93 kg)  10/25/22 201 lb (91.2 kg)     Objective:  Physical Exam Vitals reviewed.  Constitutional:      General: She is not in acute distress.    Appearance: Normal appearance. She is well-developed. She is obese.  HENT:     Head: Normocephalic and atraumatic.     Right Ear: Hearing, tympanic membrane, ear canal and external ear normal. There is no impacted cerumen.     Left Ear: Hearing, tympanic membrane, ear canal and external ear normal. There is no impacted cerumen.     Nose:     Comments: Deferred - masked    Mouth/Throat:     Comments: Deferred -  masked Eyes:     General: Lids are normal.     Extraocular Movements: Extraocular movements intact.     Conjunctiva/sclera: Conjunctivae normal.     Pupils: Pupils are equal, round, and reactive to light.     Funduscopic exam:    Right eye: No papilledema.        Left eye: No papilledema.  Neck:     Thyroid: No thyroid mass.     Vascular: No carotid bruit.  Cardiovascular:     Rate and Rhythm: Normal rate and regular rhythm.     Pulses: Normal pulses.     Heart sounds: Normal heart sounds. No murmur heard. Pulmonary:     Effort: Pulmonary effort is normal.     Breath sounds:  Normal breath sounds.  Chest:     Chest wall: No mass.  Breasts:    Tanner Score is 5.     Right: Normal. No mass or tenderness.     Left: Normal. No mass or tenderness.  Abdominal:     General: Abdomen is flat. Bowel sounds are normal. There is no distension.     Palpations: Abdomen is soft.     Tenderness: There is no abdominal tenderness.  Genitourinary:    Rectum: Guaiac result negative.  Musculoskeletal:        General: No swelling or tenderness. Normal range of motion.     Cervical back: Full passive range of motion without pain, normal range of motion and neck supple.     Right lower leg: No edema.     Left lower leg: No edema.  Lymphadenopathy:     Upper Body:     Right upper body: No supraclavicular, axillary or pectoral adenopathy.     Left upper body: No supraclavicular, axillary or pectoral adenopathy.  Skin:    General: Skin is warm and dry.     Capillary Refill: Capillary refill takes less than 2 seconds.  Neurological:     General: No focal deficit present.     Mental Status: She is alert and oriented to person, place, and time.     Cranial Nerves: No cranial nerve deficit.     Sensory: No sensory deficit.  Psychiatric:        Mood and Affect: Mood normal.        Behavior: Behavior normal.        Thought Content: Thought content normal.        Judgment: Judgment normal.         Assessment And Plan:     Encounter for annual health examination Assessment & Plan: Behavior modifications discussed and diet history reviewed.   Pt will continue to exercise regularly and modify diet with low GI, plant based foods and decrease intake of processed foods.  Recommend intake of daily multivitamin, Vitamin D, and calcium.  Recommend mammogram and colonoscopy for preventive screenings, as well as recommend immunizations that include influenza, TDAP, and Shingles   Orders: -     CBC with Differential/Platelet  Elevated cholesterol Assessment & Plan: Cholesterol  levels are stable. She is not currently taking any medications continue low fat diet  Orders: -     CMP14+EGFR -     Lipid panel  Need for influenza vaccination Assessment & Plan: Influenza vaccine administered Encouraged to take Tylenol as needed for fever or muscle aches.   Orders: -     Flu Vaccine Trivalent High Dose (Fluad)  COVID-19 vaccine administered Assessment & Plan: Covid 19 vaccine given  in office observed for 15 minutes without any adverse reaction   Orders: -     Pfizer Comirnaty Covid-19 Vaccine 100yrs & older     Return for 1 year physical. Patient was given opportunity to ask questions. Patient verbalized understanding of the plan and was able to repeat key elements of the plan. All questions were answered to their satisfaction.   [name], FNP  I, [name], FNP, have reviewed all documentation for this visit. The documentation on 10/03/23 for the exam, diagnosis, procedures, and orders are all accurate and complete.

## 2023-10-03 ENCOUNTER — Encounter: Payer: Self-pay | Admitting: Nurse Practitioner

## 2023-10-03 ENCOUNTER — Ambulatory Visit: Payer: Medicare PPO | Admitting: Nurse Practitioner

## 2023-10-03 VITALS — BP 120/74 | HR 80 | Temp 98.0°F | Ht 65.0 in | Wt 213.8 lb

## 2023-10-03 DIAGNOSIS — E7800 Pure hypercholesterolemia, unspecified: Secondary | ICD-10-CM | POA: Diagnosis not present

## 2023-10-03 DIAGNOSIS — Z23 Encounter for immunization: Secondary | ICD-10-CM

## 2023-10-03 DIAGNOSIS — Z0000 Encounter for general adult medical examination without abnormal findings: Secondary | ICD-10-CM

## 2023-10-03 DIAGNOSIS — E2839 Other primary ovarian failure: Secondary | ICD-10-CM

## 2023-10-04 LAB — CMP14+EGFR
ALT: 13 [IU]/L (ref 0–32)
AST: 13 [IU]/L (ref 0–40)
Albumin: 3.7 g/dL — ABNORMAL LOW (ref 3.9–4.9)
Alkaline Phosphatase: 47 [IU]/L (ref 44–121)
BUN/Creatinine Ratio: 16 (ref 12–28)
BUN: 12 mg/dL (ref 8–27)
Bilirubin Total: 0.8 mg/dL (ref 0.0–1.2)
CO2: 26 mmol/L (ref 20–29)
Calcium: 9.3 mg/dL (ref 8.7–10.3)
Chloride: 105 mmol/L (ref 96–106)
Creatinine, Ser: 0.77 mg/dL (ref 0.57–1.00)
Globulin, Total: 1.9 g/dL (ref 1.5–4.5)
Glucose: 84 mg/dL (ref 70–99)
Potassium: 4.6 mmol/L (ref 3.5–5.2)
Sodium: 142 mmol/L (ref 134–144)
Total Protein: 5.6 g/dL — ABNORMAL LOW (ref 6.0–8.5)
eGFR: 84 mL/min/{1.73_m2} (ref >59)

## 2023-10-04 LAB — CBC WITH DIFFERENTIAL/PLATELET
Basophils Absolute: 0.1 10*3/uL (ref 0.0–0.2)
Basos: 1 %
EOS (ABSOLUTE): 0.2 10*3/uL (ref 0.0–0.4)
Eos: 3 %
Hematocrit: 43.1 % (ref 34.0–46.6)
Hemoglobin: 13.8 g/dL (ref 11.1–15.9)
Immature Grans (Abs): 0 10*3/uL (ref 0.0–0.1)
Immature Granulocytes: 0 %
Lymphocytes Absolute: 2.4 10*3/uL (ref 0.7–3.1)
Lymphs: 34 %
MCH: 30.3 pg (ref 26.6–33.0)
MCHC: 32 g/dL (ref 31.5–35.7)
MCV: 95 fL (ref 79–97)
Monocytes Absolute: 0.6 10*3/uL (ref 0.1–0.9)
Monocytes: 8 %
Neutrophils Absolute: 3.9 10*3/uL (ref 1.4–7.0)
Neutrophils: 54 %
Platelets: 271 10*3/uL (ref 150–450)
RBC: 4.55 x10E6/uL (ref 3.77–5.28)
RDW: 11.9 % (ref 11.7–15.4)
WBC: 7.2 10*3/uL (ref 3.4–10.8)

## 2023-10-04 LAB — LIPID PANEL
Chol/HDL Ratio: 2.8 {ratio} (ref 0.0–4.4)
Cholesterol, Total: 194 mg/dL (ref 100–199)
HDL: 69 mg/dL (ref >39)
LDL Chol Calc (NIH): 108 mg/dL — ABNORMAL HIGH (ref 0–99)
Triglycerides: 96 mg/dL (ref 0–149)
VLDL Cholesterol Cal: 17 mg/dL (ref 5–40)

## 2023-10-15 DIAGNOSIS — Z23 Encounter for immunization: Secondary | ICD-10-CM | POA: Insufficient documentation

## 2023-10-15 DIAGNOSIS — E7800 Pure hypercholesterolemia, unspecified: Secondary | ICD-10-CM | POA: Insufficient documentation

## 2023-10-15 DIAGNOSIS — Z0000 Encounter for general adult medical examination without abnormal findings: Secondary | ICD-10-CM | POA: Insufficient documentation

## 2023-10-15 NOTE — Assessment & Plan Note (Signed)
Cholesterol levels are stable. She is not currently taking any medications continue low fat diet

## 2023-10-15 NOTE — Assessment & Plan Note (Signed)
Covid 19 vaccine given in office observed for 15 minutes without any adverse reaction  

## 2023-10-15 NOTE — Assessment & Plan Note (Signed)

## 2023-10-15 NOTE — Assessment & Plan Note (Signed)
Influenza vaccine administered Encouraged to take Tylenol as needed for fever or muscle aches.

## 2023-10-17 ENCOUNTER — Other Ambulatory Visit: Payer: Medicare PPO

## 2023-10-31 ENCOUNTER — Ambulatory Visit: Payer: Medicare PPO

## 2023-10-31 DIAGNOSIS — Z0000 Encounter for general adult medical examination without abnormal findings: Secondary | ICD-10-CM | POA: Diagnosis not present

## 2023-10-31 NOTE — Progress Notes (Signed)
Subjective:   Angel Holt is a 67 y.o. female who presents for Medicare Annual (Subsequent) preventive examination.  Visit Complete: Virtual I connected with  Angel Holt on 10/31/23 by a audio enabled telemedicine application and verified that I am speaking with the correct person using two identifiers.  Patient Location: Home  Provider Location: Office/Clinic  I discussed the limitations of evaluation and management by telemedicine. The patient expressed understanding and agreed to proceed.  Vital Signs: Because this visit was a virtual/telehealth visit, some criteria may be missing or patient reported. Any vitals not documented were not able to be obtained and vitals that have been documented are patient reported.  Patient Medicare AWV questionnaire was completed by the patient on 10/24/2023; I have confirmed that all information answered by patient is correct and no changes since this date.  Cardiac Risk Factors include: advanced age (>11men, >14 women)     Objective:    Today's Vitals   There is no height or weight on file to calculate BMI.     10/31/2023   11:30 AM 04/03/2023    9:14 AM 10/25/2022    8:27 AM 09/19/2019    1:19 AM 07/02/2018    8:40 PM 09/24/2015   11:41 PM  Advanced Directives  Does Patient Have a Medical Advance Directive? No No No No No No  Would patient like information on creating a medical advance directive?  No - Patient declined No - Patient declined   No - patient declined information    Current Medications (verified) Outpatient Encounter Medications as of 10/31/2023  Medication Sig   Multiple Vitamin (MULTIVITAMIN WITH MINERALS) TABS tablet Take 1 tablet by mouth daily.   No facility-administered encounter medications on file as of 10/31/2023.    Allergies (verified) Patient has no known allergies.   History: Past Medical History:  Diagnosis Date   Thyroid disease    Past Surgical History:  Procedure Laterality Date    ABDOMINAL HYSTERECTOMY     History reviewed. No pertinent family history. Social History   Socioeconomic History   Marital status: Widowed    Spouse name: Not on file   Number of children: Not on file   Years of education: Not on file   Highest education level: Not on file  Occupational History   Not on file  Tobacco Use   Smoking status: Former    Current packs/day: 0.00    Types: Cigarettes    Quit date: 12/12/1996    Years since quitting: 26.9   Smokeless tobacco: Never  Vaping Use   Vaping status: Never Used  Substance and Sexual Activity   Alcohol use: Yes    Alcohol/week: 1.0 standard drink of alcohol    Types: 1 Glasses of wine per week    Comment: Every once in a while.   Drug use: Never   Sexual activity: Never    Birth control/protection: Surgical  Other Topics Concern   Not on file  Social History Narrative   Not on file   Social Determinants of Health   Financial Resource Strain: Low Risk  (10/24/2023)   Overall Financial Resource Strain (CARDIA)    Difficulty of Paying Living Expenses: Not very hard  Food Insecurity: No Food Insecurity (10/24/2023)   Hunger Vital Sign    Worried About Running Out of Food in the Last Year: Never true    Ran Out of Food in the Last Year: Never true  Transportation Needs: No Transportation Needs (10/24/2023)  PRAPARE - Administrator, Civil Service (Medical): No    Lack of Transportation (Non-Medical): No  Physical Activity: Insufficiently Active (10/24/2023)   Exercise Vital Sign    Days of Exercise per Week: 2 days    Minutes of Exercise per Session: 30 min  Stress: No Stress Concern Present (10/24/2023)   [employer] of Occupational Health - Occupational Stress Questionnaire    Feeling of Stress : Only a little  Social Connections: Unknown (10/24/2023)   Social Connection and Isolation Panel [NHANES]    Frequency of Communication with Friends and Family: More than three times a week    Frequency of  Social Gatherings with Friends and Family: More than three times a week    Attends Religious Services: Not on file    Active Member of Clubs or Organizations: Yes    Attends Banker Meetings: More than 4 times per year    Marital Status: Widowed    Tobacco Counseling Counseling given: Not Answered   Clinical Intake:  Pre-visit preparation completed: Yes  Pain : No/denies pain     Nutritional Risks: None Diabetes: No  How often do you need to have someone help you when you read instructions, pamphlets, or other written materials from your doctor or pharmacy?: 1 - Never  Interpreter Needed?: No  Information entered by :: NAllen LPN   Activities of Daily Living    10/24/2023    9:46 AM 04/03/2023    9:14 AM  In your present state of health, do you have any difficulty performing the following activities:  Hearing? 0 0  Vision? 0 0  Difficulty concentrating or making decisions? 0 0  Walking or climbing stairs? 0 0  Dressing or bathing? 0 0  Doing errands, shopping? 0 0  Preparing Food and eating ? N N  Using the Toilet? N N  In the past six months, have you accidently leaked urine? N N  Do you have problems with loss of bowel control? N N  Managing your Medications? N N  Managing your Finances? N N  Housekeeping or managing your Housekeeping? N N    Patient Care Team: [name], FNP as PCP - General (General Practice)  Indicate any recent Medical Services you may have received from other than [facility] providers in the past year (date may be approximate).     Assessment:   This is a routine wellness examination for Angel Holt.  Hearing/Vision screen Hearing Screening - Comments:: Denies hearing issues Vision Screening - Comments:: Regular eye exams, [facility]   Goals Addressed             This Visit's Progress    Patient Stated       10/31/2023, lose weight       Depression Screen    10/31/2023   11:31 AM 10/03/2023    9:41 AM  04/03/2023    9:16 AM 10/25/2022    8:57 AM 10/25/2022    8:27 AM 04/02/2022    2:04 PM 06/09/2020    2:21 PM  PHQ 2/9 Scores  PHQ - 2 Score 0 0 0 0 0 0 3  PHQ- 9 Score 0 0 0    12    Fall Risk    10/24/2023    9:46 AM 10/03/2023    9:41 AM 04/03/2023    9:16 AM 10/25/2022    8:56 AM 10/25/2022    8:27 AM  Fall Risk   Falls in the past year?  0 0 0 1 0  Number falls in past yr: 0 0  0 0  Injury with Fall? 0 0  0 0  Risk for fall due to : No Fall Risks No Fall Risks  No Fall Risks No Fall Risks  Follow up Falls prevention discussed;Falls evaluation completed Falls evaluation completed  Falls evaluation completed Falls prevention discussed;Education provided;Falls evaluation completed    MEDICARE RISK AT HOME: Medicare Risk at Home Any stairs in or around the home?: Yes If so, are there any without handrails?: No Home free of loose throw rugs in walkways, pet beds, electrical cords, etc?: No Adequate lighting in your home to reduce risk of falls?: Yes Life alert?: No Use of a cane, walker or w/c?: No Grab bars in the bathroom?: Yes Shower chair or bench in shower?: No Elevated toilet seat or a handicapped toilet?: No  TIMED UP AND GO:  Was the test performed?  No    Cognitive Function:        10/31/2023   11:31 AM 04/03/2023    9:16 AM 10/25/2022    8:28 AM  6CIT Screen  What Year? 0 points 0 points 0 points  What month? 0 points 0 points 0 points  What time? 0 points 0 points 0 points  Count back from 20 0 points 0 points 0 points  Months in reverse 0 points 4 points 0 points  Repeat phrase 2 points 2 points 2 points  Total Score 2 points 6 points 2 points    Immunizations Immunization History  Administered Date(s) Administered   Fluad Quad(high Dose 65+) 10/25/2022   Fluad Trivalent(High Dose 65+) 10/03/2023   Influenza,inj,Quad PF,6+ Mos 12/23/2019   Moderna Sars-Covid-2 Vaccination 11/18/2020   PFIZER(Purple Top)SARS-COV-2 Vaccination 02/29/2020, 03/22/2020    PNEUMOCOCCAL CONJUGATE-20 04/02/2022   Pfizer(Comirnaty)Fall Seasonal Vaccine 12 years and older 04/03/2023, 10/03/2023   Tdap 03/30/2021    TDAP status: Up to date  Flu Vaccine status: Up to date  Pneumococcal vaccine status: Up to date  Covid-19 vaccine status: Completed vaccines  Qualifies for Shingles Vaccine? Yes   Zostavax completed No   Shingrix Completed?: No.    Education has been provided regarding the importance of this vaccine. Patient has been advised to call insurance company to determine out of pocket expense if they have not yet received this vaccine. Advised may also receive vaccine at local pharmacy or Health Dept. Verbalized acceptance and understanding.  Screening Tests Health Maintenance  Topic Date Due   DEXA SCAN  Never done   Zoster Vaccines- Shingrix (1 of 2) 01/03/2024 (Originally 08/05/2006)   MAMMOGRAM  10/23/2024   Medicare Annual Wellness (AWV)  10/30/2024   DTaP/Tdap/Td (2 - Td or Tdap) 03/31/2031   Colonoscopy  10/17/2032   Pneumonia Vaccine 72+ Years old  Completed   INFLUENZA VACCINE  Completed   COVID-19 Vaccine  Completed   Hepatitis C Screening  Completed   HPV VACCINES  Aged Out    Health Maintenance  Health Maintenance Due  Topic Date Due   DEXA SCAN  Never done    Colorectal cancer screening: Type of screening: Colonoscopy. Completed 10/17/2022. Repeat every 10 years  Mammogram status: Completed 10/23/2022. Repeat every year  Bone Density status: Ordered 04/03/2023. Pt provided with contact info and advised to call to schedule appt.  Lung Cancer Screening: (Low Dose CT Chest recommended if Age 72-80 years, 20 pack-year currently smoking OR have quit w/in 15years.) does not qualify.   Lung Cancer Screening Referral:  no  Additional Screening:  Hepatitis C Screening: does qualify; Completed 12/23/2019  Vision Screening: Recommended annual ophthalmology exams for early detection of glaucoma and other disorders of the eye. Is the  patient up to date with their annual eye exam?  Yes  Who is the provider or what is the name of the office in which the patient attends annual eye exams? [facility] If pt is not established with a provider, would they like to be referred to a provider to establish care? No .   Dental Screening: Recommended annual dental exams for proper oral hygiene  Diabetic Foot Exam: n/a  Community Resource Referral / Chronic Care Management: CRR required this visit?  No   CCM required this visit?  No     Plan:     I have personally reviewed and noted the following in the patient's chart:   Medical and social history Use of alcohol, tobacco or illicit drugs  Current medications and supplements including opioid prescriptions. Patient is not currently taking opioid prescriptions. Functional ability and status Nutritional status Physical activity Advanced directives List of other physicians Hospitalizations, surgeries, and ER visits in previous 12 months Vitals Screenings to include cognitive, depression, and falls Referrals and appointments  In addition, I have reviewed and discussed with patient certain preventive protocols, quality metrics, and best practice recommendations. A written personalized care plan for preventive services as well as general preventive health recommendations were provided to patient.     Barb Merino, LPN   16/09/[id number]   After Visit Summary: (MyChart) Due to this being a telephonic visit, the after visit summary with patients personalized plan was offered to patient via MyChart   Nurse Notes: none

## 2023-10-31 NOTE — Patient Instructions (Signed)
Angel Holt , Thank you for taking time to come for your Medicare Wellness Visit. I appreciate your ongoing commitment to your health goals. Please review the following plan we discussed and let me know if I can assist you in the future.   Referrals/Orders/Follow-Ups/Clinician Recommendations: none  This is a list of the screening recommended for you and due dates:  Health Maintenance  Topic Date Due   DEXA scan (bone density measurement)  Never done   Zoster (Shingles) Vaccine (1 of 2) 01/03/2024*   Mammogram  10/23/2024   Medicare Annual Wellness Visit  10/30/2024   DTaP/Tdap/Td vaccine (2 - Td or Tdap) 03/31/2031   Colon Cancer Screening  10/17/2032   Pneumonia Vaccine  Completed   Flu Shot  Completed   COVID-19 Vaccine  Completed   Hepatitis C Screening  Completed   HPV Vaccine  Aged Out  *Topic was postponed. The date shown is not the original due date.    Advanced directives: (ACP Link)Information on Advanced Care Planning can be found at [facility] Advance Health Care Directives Advance Health Care Directives ([email or web address]   Next Medicare Annual Wellness Visit scheduled for next year: No, office will schedule appointment  Insert Preventive Care attachment Insert FALL PREVENTION attachment if needed

## 2023-12-04 ENCOUNTER — Encounter: Payer: Self-pay | Admitting: Nurse Practitioner

## 2024-01-02 ENCOUNTER — Ambulatory Visit: Payer: Medicare PPO | Admitting: Podiatry

## 2024-01-17 ENCOUNTER — Ambulatory Visit: Payer: Medicare PPO | Admitting: Podiatry

## 2024-01-24 ENCOUNTER — Ambulatory Visit (INDEPENDENT_AMBULATORY_CARE_PROVIDER_SITE_OTHER): Payer: Medicare PPO

## 2024-01-24 ENCOUNTER — Ambulatory Visit: Payer: Medicare PPO | Admitting: Podiatry

## 2024-01-24 DIAGNOSIS — M79672 Pain in left foot: Secondary | ICD-10-CM | POA: Diagnosis not present

## 2024-01-24 DIAGNOSIS — M7662 Achilles tendinitis, left leg: Secondary | ICD-10-CM

## 2024-01-24 NOTE — Progress Notes (Signed)
  Subjective:  Patient ID: Angel Holt, female    DOB: 07/05/1956,  MRN: [id number]  Chief Complaint  Patient presents with   Foot Pain     I have soreness and numbness on the side like it fell asleep over time, and tender when talking in the ankle.     68 y.o. female presents with the above complaint.  Patient presents with left Achilles tendon insertional pain palpation hurts with ambulation as her pressure hurts while walking.  She has not seen MRIs prior to seeing me pain scale 7 out of 10 dull aching nature.  Hurts in the back of the heel.   Review of Systems: Negative except as noted in the HPI. Denies N/V/F/Ch.  Past Medical History:  Diagnosis Date   Thyroid  disease     Current Outpatient Medications:    Multiple Vitamin (MULTIVITAMIN WITH MINERALS) TABS tablet, Take 1 tablet by mouth daily., Disp: , Rfl:   Social History   Tobacco Use  Smoking Status Former   Current packs/day: 0.00   Types: Cigarettes   Quit date: 12/12/1996   Years since quitting: 27.1  Smokeless Tobacco Never    No Known Allergies Objective:  There were no vitals filed for this visit. There is no height or weight on file to calculate BMI. Constitutional Well developed. Well nourished.  Vascular Dorsalis pedis pulses palpable bilaterally. Posterior tibial pulses palpable bilaterally. Capillary refill normal to all digits.  No cyanosis or clubbing noted. Pedal hair growth normal.  Neurologic Normal speech. Oriented to person, place, and time. Epicritic sensation to light touch grossly present bilaterally.  Dermatologic Nails well groomed and normal in appearance. No open wounds. No skin lesions.  Orthopedic: Pain on palpation left Achilles tendon insertion pain with dorsiflexion of the ankle joint no pain with plantarflexion of the ankle joint.  Positive Haglund's deformity noted positive gastrocnemius equinus noted.  No pain at the peroneal tendon posterior tibial tendon ATFL ligament    Radiographs: 3 views of skeletally mature adult left foot: Mild posterior spurring noted mild plantar spurring noted no midfoot arthritis noted. Assessment:   1. Left foot pain    Plan:  Patient was evaluated and treated and all questions answered.  Left Achilles tendinitis -All questions and concerns were discussed with the patient extensive due to given the amount of pain that she is experiencing she will benefit from cam boot immobilization patient agrees with plan like to pursue cam boot immobilization -Cam boot was dispensed.  No follow-ups on file.

## 2024-02-26 ENCOUNTER — Ambulatory Visit: Payer: Medicare PPO | Admitting: Podiatry

## 2024-02-26 DIAGNOSIS — M7662 Achilles tendinitis, left leg: Secondary | ICD-10-CM | POA: Diagnosis not present

## 2024-02-26 DIAGNOSIS — M9262 Juvenile osteochondrosis of tarsus, left ankle: Secondary | ICD-10-CM | POA: Diagnosis not present

## 2024-02-26 NOTE — Progress Notes (Signed)
  Subjective:  Patient ID: Angel Holt, female    DOB: 1956/04/15,  MRN: [id number]  Chief Complaint  Patient presents with   Achilles tendinitis, left leg    Pt stated that she is doing better she does still have some discomfort she stated that the boot does help     68 y.o. female presents with the above complaint.  Patient presents with complaint of follow-up Achilles tendon that is hampered immobilization help.  Still residual pain left denies any other acute complaints   Review of Systems: Negative except as noted in the HPI. Denies N/V/F/Ch.  Past Medical History:  Diagnosis Date   Thyroid disease     Current Outpatient Medications:    Multiple Vitamin (MULTIVITAMIN WITH MINERALS) TABS tablet, Take 1 tablet by mouth daily., Disp: , Rfl:   Social History   Tobacco Use  Smoking Status Former   Current packs/day: 0.00   Types: Cigarettes   Quit date: 12/12/1996   Years since quitting: 27.2  Smokeless Tobacco Never    No Known Allergies Objective:  There were no vitals filed for this visit. There is no height or weight on file to calculate BMI. Constitutional Well developed. Well nourished.  Vascular Dorsalis pedis pulses palpable bilaterally. Posterior tibial pulses palpable bilaterally. Capillary refill normal to all digits.  No cyanosis or clubbing noted. Pedal hair growth normal.  Neurologic Normal speech. Oriented to person, place, and time. Epicritic sensation to light touch grossly present bilaterally.  Dermatologic Nails well groomed and normal in appearance. No open wounds. No skin lesions.  Orthopedic: Pain on palpation left Achilles tendon insertion pain with dorsiflexion of the ankle joint no pain with plantarflexion of the ankle joint.  Positive Haglund's deformity noted positive gastrocnemius equinus noted.  No pain at the peroneal tendon posterior tibial tendon ATFL ligament   Radiographs: 3 views of skeletally mature adult left foot: Mild  posterior spurring noted mild plantar spurring noted no midfoot arthritis noted. Assessment:   1. Achilles tendinitis, left leg   2. Haglund's deformity, left     Plan:  Patient was evaluated and treated and all questions answered.  Left Achilles tendinitis with underlying Haglund's deformity -All questions and concerns were discussed with the patient extensive patient will transition from cam boot into a Tri-Lock ankle brace Tri-Lock ankle brace was dispensed -She will also benefit from steroid injection I discussed risk for transition with this she states that she will like to proceed-a steroid injection -A steroid injection was performed at Left Kager's fat pad using 1% plain Lidocaine and 10 mg of Kenalog. This was well tolerated. -Shoe gear modification discussed   No follow-ups on file.

## 2024-04-06 ENCOUNTER — Encounter: Payer: Medicare PPO | Admitting: Nurse Practitioner

## 2024-04-08 ENCOUNTER — Ambulatory Visit: Admitting: Podiatry

## 2024-04-08 DIAGNOSIS — M7662 Achilles tendinitis, left leg: Secondary | ICD-10-CM

## 2024-04-08 DIAGNOSIS — M9262 Juvenile osteochondrosis of tarsus, left ankle: Secondary | ICD-10-CM

## 2024-04-08 NOTE — Progress Notes (Signed)
  Subjective:  Patient ID: Angel Holt, female    DOB: 1956/10/10,  MRN: [id number]  Chief Complaint  Patient presents with   Foot Pain    6 week follow up for achilles tendinitis pt stated that she is doing better she stated that she does not have any discomfort at this time     68 y.o. female presents with the above complaint.  Patient presents with complaint of left Achilles tendon follow-up.  She states she is doing a lot better no further pain injection helped transition helped   Review of Systems: Negative except as noted in the HPI. Denies N/V/F/Ch.  Past Medical History:  Diagnosis Date   Thyroid  disease     Current Outpatient Medications:    Multiple Vitamin (MULTIVITAMIN WITH MINERALS) TABS tablet, Take 1 tablet by mouth daily., Disp: , Rfl:   Social History   Tobacco Use  Smoking Status Former   Current packs/day: 0.00   Types: Cigarettes   Quit date: 12/12/1996   Years since quitting: 27.3  Smokeless Tobacco Never    No Known Allergies Objective:  There were no vitals filed for this visit. There is no height or weight on file to calculate BMI. Constitutional Well developed. Well nourished.  Vascular Dorsalis pedis pulses palpable bilaterally. Posterior tibial pulses palpable bilaterally. Capillary refill normal to all digits.  No cyanosis or clubbing noted. Pedal hair growth normal.  Neurologic Normal speech. Oriented to person, place, and time. Epicritic sensation to light touch grossly present bilaterally.  Dermatologic Nails well groomed and normal in appearance. No open wounds. No skin lesions.  Orthopedic: No further Pain on palpation left Achilles tendon insertion pain with dorsiflexion of the ankle joint no pain with plantarflexion of the ankle joint.  Positive Haglund's deformity noted positive gastrocnemius equinus noted.  No pain at the peroneal tendon posterior tibial tendon ATFL ligament   Radiographs: 3 views of skeletally mature adult  left foot: Mild posterior spurring noted mild plantar spurring noted no midfoot arthritis noted. Assessment:   No diagnosis found.   Plan:  Patient was evaluated and treated and all questions answered.  Left Achilles tendinitis with underlying Haglund's deformity -All questions and concerns were discussed with the patient extensive patient clinically doing much better patient has completely resolved her pain.  She does not have any discomfort at this time I discussed shoe gear modification.  She states understanding if any foot and ankle issues in the future she will come back and see me.  No follow-ups on file.

## 2024-10-06 ENCOUNTER — Ambulatory Visit: Payer: Medicare PPO | Admitting: Nurse Practitioner

## 2024-10-06 ENCOUNTER — Encounter: Payer: Self-pay | Admitting: Nurse Practitioner

## 2024-10-06 VITALS — BP 114/80 | HR 75 | Temp 98.2°F | Ht 65.0 in | Wt 225.0 lb

## 2024-10-06 DIAGNOSIS — Z23 Encounter for immunization: Secondary | ICD-10-CM | POA: Diagnosis not present

## 2024-10-06 DIAGNOSIS — E6609 Other obesity due to excess calories: Secondary | ICD-10-CM | POA: Diagnosis not present

## 2024-10-06 DIAGNOSIS — E7800 Pure hypercholesterolemia, unspecified: Secondary | ICD-10-CM | POA: Diagnosis not present

## 2024-10-06 DIAGNOSIS — Z79899 Other long term (current) drug therapy: Secondary | ICD-10-CM

## 2024-10-06 DIAGNOSIS — E2839 Other primary ovarian failure: Secondary | ICD-10-CM

## 2024-10-06 DIAGNOSIS — Z1231 Encounter for screening mammogram for malignant neoplasm of breast: Secondary | ICD-10-CM

## 2024-10-06 DIAGNOSIS — Z0000 Encounter for general adult medical examination without abnormal findings: Secondary | ICD-10-CM

## 2024-10-06 DIAGNOSIS — Z6837 Body mass index (BMI) 37.0-37.9, adult: Secondary | ICD-10-CM | POA: Diagnosis not present

## 2024-10-06 DIAGNOSIS — E66812 Obesity, class 2: Secondary | ICD-10-CM

## 2024-10-06 NOTE — Progress Notes (Signed)
 I,Jameka J Llittleton, CMA,acting as a neurosurgeon for [employer], FNP.,have documented all relevant documentation on the behalf of Angel Ada, FNP,as directed by  Angel Ada, FNP while in the presence of Angel Ada, FNP.  Subjective:    Patient ID: Angel Holt , female    DOB: Jul 15, 1956 , 68 y.o.   MRN: [id number]  Chief Complaint  Patient presents with   Annual Exam    Patient presents today for HM, Patient reports compliance with medication. Patient denies any chest pain, SOB, or headaches. Patient has no concerns today.       HPI  Discussed the use of AI scribe software for clinical note transcription with the patient, who gave verbal consent to proceed.  History of Present Illness Angel Holt is a 68 year old female who presents for an annual physical exam.  She has persistent foot swelling and pain. She previously wore a boot and received a cortisone injection, which provided temporary relief, but symptoms returned after two to three months. She has purchased [employer], which sometimes help with her symptoms.  She has not yet had her mammogram or bone density scan, and appointments are being scheduled. She is not currently exercising regularly and wants to improve her physical activity. Her weight was 213 pounds a year ago, and she wants to 'get back on track' with her health. She discusses dietary habits, noting a tendency to eat bread and sweets, and wants to make healthier choices. She mentions attending functions where she tends to overeat and plans to cut back on portion sizes.  She has not received the shingles vaccine due to cost concerns and insurance coverage issues. She has received the flu vaccine and is due for the COVID vaccine, which she plans to get at a pharmacy.  She reports occasional difficulty swallowing when eating quickly, particularly with hamburgers, and needs to sit up to help food 'slide down'. She mentions a small knot near her bone that has  mostly resolved.   Past Medical History:  Diagnosis Date   Thyroid  disease      History reviewed. No pertinent family history.   Current Outpatient Medications:    Multiple Vitamin (MULTIVITAMIN WITH MINERALS) TABS tablet, Take 1 tablet by mouth daily., Disp: , Rfl:    No Known Allergies    The patient states she uses status post hysterectomy for birth control. No LMP recorded. Patient has had a hysterectomy.. Negative for Dysmenorrhea and Negative for Menorrhagia. Negative for: breast discharge, breast lump(s), breast pain and breast self exam. Associated symptoms include abnormal vaginal bleeding. Pertinent negatives include abnormal bleeding (hematology), anxiety, decreased libido, depression, difficulty falling sleep, dyspareunia, history of infertility, nocturia, sexual dysfunction, sleep disturbances, urinary incontinence, urinary urgency, vaginal discharge and vaginal itching. Diet regular.The patient states her exercise level is    . The patient's tobacco use is:  Social History   Tobacco Use  Smoking Status Former   Current packs/day: 0.00   Types: Cigarettes   Quit date: 12/12/1996   Years since quitting: 27.8  Smokeless Tobacco Never  . She has been exposed to passive smoke. The patient's alcohol use is:  Social History   Substance and Sexual Activity  Alcohol Use Yes   Alcohol/week: 1.0 standard drink of alcohol   Types: 1 Glasses of wine per week   Comment: Every once in a while.    Review of Systems  Constitutional: Negative.   HENT: Negative.    Eyes: Negative.   Respiratory:  Negative.    Cardiovascular: Negative.   Gastrointestinal: Negative.   Endocrine: Negative.   Genitourinary: Negative.   Musculoskeletal: Negative.   Skin: Negative.   Allergic/Immunologic: Negative.   Neurological: Negative.   Hematological: Negative.   Psychiatric/Behavioral: Negative.       Today's Vitals   10/06/24 0950  BP: 114/80  Pulse: 75  Temp: 98.2 F (36.8 C)   TempSrc: Oral  Weight: 225 lb (102.1 kg)  Height: 5' 5 (1.651 m)  PainSc: 0-No pain   Body mass index is 37.44 kg/m.  Wt Readings from Last 3 Encounters:  10/06/24 225 lb (102.1 kg)  10/03/23 213 lb 12.8 oz (97 kg)  04/03/23 205 lb (93 kg)     Objective:  Physical Exam Vitals and nursing note reviewed.  Constitutional:      General: She is not in acute distress.    Appearance: Normal appearance. She is well-developed. She is obese.  HENT:     Head: Normocephalic and atraumatic.     Right Ear: Hearing, tympanic membrane, ear canal and external ear normal. There is no impacted cerumen.     Left Ear: Hearing, tympanic membrane, ear canal and external ear normal. There is no impacted cerumen.     Nose: Nose normal.     Mouth/Throat:     Mouth: Mucous membranes are moist.  Eyes:     General: Lids are normal.     Extraocular Movements: Extraocular movements intact.     Conjunctiva/sclera: Conjunctivae normal.     Pupils: Pupils are equal, round, and reactive to light.     Funduscopic exam:    Right eye: No papilledema.        Left eye: No papilledema.  Neck:     Thyroid : No thyroid  mass.     Vascular: No carotid bruit.  Cardiovascular:     Rate and Rhythm: Normal rate and regular rhythm.     Pulses: Normal pulses.     Heart sounds: Normal heart sounds. No murmur heard. Pulmonary:     Effort: Pulmonary effort is normal. No respiratory distress.     Breath sounds: Normal breath sounds. No wheezing.  Chest:     Chest wall: No mass.  Breasts:    Tanner Score is 5.     Right: Normal. No mass or tenderness.     Left: Normal. No mass or tenderness.  Abdominal:     General: Abdomen is flat. Bowel sounds are normal. There is no distension.     Palpations: Abdomen is soft.     Tenderness: There is no abdominal tenderness.  Genitourinary:    Rectum: Guaiac result negative.  Musculoskeletal:        General: No swelling or tenderness. Normal range of motion.     Cervical  back: Full passive range of motion without pain, normal range of motion and neck supple.     Right lower leg: No edema.     Left lower leg: No edema.  Lymphadenopathy:     Upper Body:     Right upper body: No supraclavicular, axillary or pectoral adenopathy.     Left upper body: No supraclavicular, axillary or pectoral adenopathy.  Skin:    General: Skin is warm and dry.     Capillary Refill: Capillary refill takes less than 2 seconds.  Neurological:     General: No focal deficit present.     Mental Status: She is alert and oriented to person, place, and time.     Cranial  Nerves: No cranial nerve deficit.     Sensory: No sensory deficit.  Psychiatric:        Mood and Affect: Mood normal.        Behavior: Behavior normal.        Thought Content: Thought content normal.        Judgment: Judgment normal.      Assessment And Plan:     Encounter for annual health examination Assessment & Plan: Routine wellness visit focused on exercise, diet, and menopause impact. - Schedule mammogram and bone density scan. - Encourage 30 minutes of daily walking. - Advise on healthy diet, reducing bread and sweets.   Elevated cholesterol Assessment & Plan: Discussed hypercholesterolemia management with focus on diet and exercise. - Order lab tests to assess cholesterol levels. - Advise on dietary modifications to manage cholesterol.  Orders: -     CMP14+EGFR -     Lipid panel  Need for influenza vaccination Assessment & Plan: Influenza vaccine administered Encouraged to take Tylenol  as needed for fever or muscle aches.   Orders: -     Flu vaccine HIGH DOSE PF(Fluzone Trivalent)  Class 2 obesity due to excess calories with body mass index (BMI) of 37.0 to 37.9 in adult, unspecified whether serious comorbidity present Assessment & Plan: Class 2 obesity with emphasis on weight management through diet and exercise. - Encourage weight loss through diet and exercise. - Advise on reducing  intake of bread and sweets.   Other long term (current) drug therapy -     CBC  Decreased estrogen level -     DG Bone Density; Future  Encounter for screening mammogram for breast cancer -     3D Screening Mammogram, Left and Right; Future   Return for 1 year physical. Patient was given opportunity to ask questions. Patient verbalized understanding of the plan and was able to repeat key elements of the plan. All questions were answered to their satisfaction.   Angel Ada, FNP  I, Angel Ada, FNP, have reviewed all documentation for this visit. The documentation on 10/06/24 for the exam, diagnosis, procedures, and orders are all accurate and complete.

## 2024-10-06 NOTE — Patient Instructions (Signed)

## 2024-10-07 LAB — CMP14+EGFR
ALT: 16 IU/L (ref 0–32)
AST: 16 IU/L (ref 0–40)
Albumin: 3.8 g/dL — ABNORMAL LOW (ref 3.9–4.9)
Alkaline Phosphatase: 44 IU/L — ABNORMAL LOW (ref 49–135)
BUN/Creatinine Ratio: 14 (ref 12–28)
BUN: 12 mg/dL (ref 8–27)
Bilirubin Total: 0.8 mg/dL (ref 0.0–1.2)
CO2: 23 mmol/L (ref 20–29)
Calcium: 9.1 mg/dL (ref 8.7–10.3)
Chloride: 107 mmol/L — ABNORMAL HIGH (ref 96–106)
Creatinine, Ser: 0.88 mg/dL (ref 0.57–1.00)
Globulin, Total: 2 g/dL (ref 1.5–4.5)
Glucose: 93 mg/dL (ref 70–99)
Potassium: 4 mmol/L (ref 3.5–5.2)
Sodium: 143 mmol/L (ref 134–144)
Total Protein: 5.8 g/dL — ABNORMAL LOW (ref 6.0–8.5)
eGFR: 72 mL/min/1.73 (ref >59)

## 2024-10-07 LAB — LIPID PANEL
Chol/HDL Ratio: 2.8 ratio (ref 0.0–4.4)
Cholesterol, Total: 198 mg/dL (ref 100–199)
HDL: 71 mg/dL (ref >39)
LDL Chol Calc (NIH): 111 mg/dL — ABNORMAL HIGH (ref 0–99)
Triglycerides: 87 mg/dL (ref 0–149)
VLDL Cholesterol Cal: 16 mg/dL (ref 5–40)

## 2024-10-07 LAB — CBC
Hematocrit: 44.7 % (ref 34.0–46.6)
Hemoglobin: 14.4 g/dL (ref 11.1–15.9)
MCH: 30.8 pg (ref 26.6–33.0)
MCHC: 32.2 g/dL (ref 31.5–35.7)
MCV: 96 fL (ref 79–97)
Platelets: 268 x10E3/uL (ref 150–450)
RBC: 4.68 x10E6/uL (ref 3.77–5.28)
RDW: 12.5 % (ref 11.7–15.4)
WBC: 8 x10E3/uL (ref 3.4–10.8)

## 2024-10-13 ENCOUNTER — Ambulatory Visit: Payer: Self-pay | Admitting: Nurse Practitioner

## 2024-10-13 DIAGNOSIS — E66812 Obesity, class 2: Secondary | ICD-10-CM | POA: Insufficient documentation

## 2024-10-13 NOTE — Assessment & Plan Note (Signed)
 Influenza vaccine administered Encouraged to take Tylenol as needed for fever or muscle aches.

## 2024-10-13 NOTE — Assessment & Plan Note (Signed)
 Routine wellness visit focused on exercise, diet, and menopause impact. - Schedule mammogram and bone density scan. - Encourage 30 minutes of daily walking. - Advise on healthy diet, reducing bread and sweets.

## 2024-10-13 NOTE — Assessment & Plan Note (Signed)
 Class 2 obesity with emphasis on weight management through diet and exercise. - Encourage weight loss through diet and exercise. - Advise on reducing intake of bread and sweets.

## 2024-10-13 NOTE — Assessment & Plan Note (Signed)
 Discussed hypercholesterolemia management with focus on diet and exercise. - Order lab tests to assess cholesterol levels. - Advise on dietary modifications to manage cholesterol.

## 2024-11-03 ENCOUNTER — Ambulatory Visit
Admission: RE | Admit: 2024-11-03 | Discharge: 2024-11-03 | Disposition: A | Source: Ambulatory Visit | Attending: Nurse Practitioner | Admitting: Nurse Practitioner

## 2024-11-03 DIAGNOSIS — Z1231 Encounter for screening mammogram for malignant neoplasm of breast: Secondary | ICD-10-CM | POA: Diagnosis not present
